# Patient Record
Sex: Male | Born: 1975 | Race: Black or African American | Hispanic: No | Marital: Single | Smoking: Never smoker
Health system: Southern US, Community
[De-identification: ages and names within clinical notes are randomized; demographics above are authoritative.]

## PROBLEM LIST (undated history)

## (undated) DIAGNOSIS — J45909 Unspecified asthma, uncomplicated: Secondary | ICD-10-CM

## (undated) HISTORY — DX: Unspecified asthma, uncomplicated: J45.909

---

## 2005-01-15 ENCOUNTER — Emergency Department (HOSPITAL_COMMUNITY): Admission: EM | Admit: 2005-01-15 | Discharge: 2005-01-15 | Payer: Self-pay | Admitting: Emergency Medicine

## 2005-01-23 ENCOUNTER — Emergency Department (HOSPITAL_COMMUNITY): Admission: EM | Admit: 2005-01-23 | Discharge: 2005-01-23 | Payer: Self-pay | Admitting: Emergency Medicine

## 2005-01-25 ENCOUNTER — Emergency Department (HOSPITAL_COMMUNITY): Admission: EM | Admit: 2005-01-25 | Discharge: 2005-01-25 | Payer: Self-pay | Admitting: Emergency Medicine

## 2012-01-03 ENCOUNTER — Ambulatory Visit (INDEPENDENT_AMBULATORY_CARE_PROVIDER_SITE_OTHER): Payer: BC Managed Care – PPO

## 2012-01-03 DIAGNOSIS — J069 Acute upper respiratory infection, unspecified: Secondary | ICD-10-CM

## 2012-05-01 ENCOUNTER — Ambulatory Visit (INDEPENDENT_AMBULATORY_CARE_PROVIDER_SITE_OTHER): Payer: BC Managed Care – PPO | Admitting: Family Medicine

## 2012-05-01 VITALS — BP 123/73 | HR 61 | Temp 97.9°F | Resp 16 | Ht 68.25 in | Wt 227.4 lb

## 2012-05-01 DIAGNOSIS — M79609 Pain in unspecified limb: Secondary | ICD-10-CM

## 2012-05-01 DIAGNOSIS — S86912A Strain of unspecified muscle(s) and tendon(s) at lower leg level, left leg, initial encounter: Secondary | ICD-10-CM

## 2012-05-01 DIAGNOSIS — S838X9A Sprain of other specified parts of unspecified knee, initial encounter: Secondary | ICD-10-CM

## 2012-05-01 MED ORDER — PREDNISONE 20 MG PO TABS: 40.0000 mg | ORAL_TABLET | Freq: Every day | ORAL | Status: AC

## 2012-05-01 NOTE — Progress Notes (Signed)
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 He notes that he was playing basketball Thursday night but has no recollection of any specific injury. We woke up on Friday, the knee had swollen and was sore. He says the pain is less today although he still limping.  Objective: No acute distress, antalgic gait  Left Knee: Mild diffuse swelling with no localized tenderness. There is mild anterior drawer laxity but no lateral ligamentous instability. He has full range of motion.   assessment: Knee strain, left, mild and improving  Plan: Calling in prednisone 40 mg daily for 5 days. I suggest the patient lay off the basketball during this time. If he feels he cannot work on Monday, we should go ahead and get an MRI of that left knee.

## 2012-05-04 ENCOUNTER — Telehealth: Payer: Self-pay

## 2012-05-04 NOTE — Telephone Encounter (Signed)
Have patient return later this week (Friday or Saturday) and I will recheck the knee

## 2012-05-04 NOTE — Telephone Encounter (Signed)
Pt states that Dr.Lauenstein told him to give him a call regarding his knee. Pt states that his knee is no longer hurting but it is still swollen, also there is tightness behind his knee.

## 2012-05-05 NOTE — Telephone Encounter (Signed)
LMOM for pt to RTC for Dr L to recheck knee if he still has swelling/tightness later in week. Gave pt Dr L's hours on weekend.

## 2013-02-16 ENCOUNTER — Ambulatory Visit (INDEPENDENT_AMBULATORY_CARE_PROVIDER_SITE_OTHER): Payer: BC Managed Care – PPO | Admitting: Family Medicine

## 2013-02-16 VITALS — BP 140/90 | HR 96 | Temp 98.6°F | Resp 16 | Ht 68.0 in | Wt 228.6 lb

## 2013-02-16 DIAGNOSIS — S93409A Sprain of unspecified ligament of unspecified ankle, initial encounter: Secondary | ICD-10-CM

## 2013-02-16 MED ORDER — TRAMADOL HCL 50 MG PO TABS: 50.0000 mg | ORAL_TABLET | Freq: Every evening | ORAL | Status: DC | PRN

## 2013-02-16 MED ORDER — MELOXICAM 15 MG PO TABS: 15.0000 mg | ORAL_TABLET | Freq: Every day | ORAL | Status: DC

## 2013-02-16 MED ORDER — KETOROLAC TROMETHAMINE 60 MG/2ML IM SOLN
60.0000 mg | Freq: Once | INTRAMUSCULAR | Status: AC
  Administered 2013-02-16: 60 mg via INTRAMUSCULAR

## 2013-02-16 NOTE — Patient Instructions (Signed)
Very nice to me. He do have a lateral ankle sprain. Continue to wear that compression sleeve for the next 48 hours. I would ice it 20 minutes 4-5 times daily for the next week. I'm going to keep you out of work until Monday of next week. Then he will start again with no restrictions. I'm giving U. and anti-inflammatory, locking. Taken daily for the next week. Only stopped his medicine if it hurts her stomach. I'm also giving a medicine called tramadol you can take at night if needed. Come back Monday if you are not any better.

## 2013-02-16 NOTE — Progress Notes (Signed)
Chief complaint: Left ankle pain  History of present illness: The patient is having left ankle pain x1 day duration. Patient did roll his ankle in what appears to be a inversion injury. Patient had pain on the anterior lateral aspect of the ankle. Patient state that was fairly difficult to walk but now he is able to ambulate if necessary. Patient denies any numbness or discoloration but states he has had some swelling and put a compression sleeve on his ankle. Patient has not taken any medications for this. Patient has no history of injury to this ankle previously.  Past Medical History  Diagnosis Date  . Asthma    No past surgical history on file.  Family History  Problem Relation Age of Onset  . Heart disease Paternal Grandfather     History  Substance Use Topics  . Smoking status: Never Smoker   . Smokeless tobacco: Not on file  . Alcohol Use: Not on file    Physical exam Blood pressure 140/90, pulse 96, temperature 98.6 F (37 C), temperature source Oral, resp. rate 16, height 5\' 8"  (1.727 m), weight 228 lb 9.6 oz (103.692 kg), SpO2 98.00%. General: No apparent distress alert and oriented x3 mood and affect normal Respiratory: Patient's speak in full sentences and does not appear short of breath Skin: Warm dry intact with no signs of infection or rash Neuro: Cranial nerves II through XII are intact, neurovascularly intact in all extremities with 2+ DTRs and 2+ pulses. Left ankle: Patient does have trace effusion of the left ankle mostly over the ATFL. Patient is tender to palpation over the ATFL but not tender over the medial and lateral malleolus as well as nontender over the fifth metatarsal base. He is neurovascularly intact distally and has good strength except for resisted eversion of the ankle. Patient is able to walk 5 steps.  Assessment: Lateral ankle sprain grade 1  Plan: Continue compression sleeve Icing protocol Patient given anti-inflammatories. Patient given home  exercise program to start in 24 hours Patient will return to work after 48 hours with no restrictions. Patient will return on Monday if he continues to have pain and we'll consider doing x-rays but at this time patient passes [employer].

## 2013-03-10 ENCOUNTER — Telehealth: Payer: Self-pay

## 2013-03-10 NOTE — Telephone Encounter (Addendum)
PT WOULD LIKE TO COME BY AND P/U A COPY OF HIS MEDICAL RECORDS. PLEASE CALL [phone] WAS TOLD IT COULD TAKE UP TO 48 TO 72 HRS.    PT CALLED AGAIN FOR STATUS OF HIS MEDICAL RECORDS. PLEASE CALL [phone]. PT WAS TOLD HE WOULD GET A CALL WHEN RECORDS ARE READY FOR P/U

## 2013-03-18 NOTE — Telephone Encounter (Signed)
Spoke to patient that medical records are ready for pick up at the front office. Please have him sign release that I have filled out for him. Thanks

## 2013-10-03 ENCOUNTER — Ambulatory Visit (INDEPENDENT_AMBULATORY_CARE_PROVIDER_SITE_OTHER): Payer: BC Managed Care – PPO | Admitting: Emergency Medicine

## 2013-10-03 VITALS — BP 118/84 | HR 90 | Temp 101.1°F | Resp 18 | Ht 68.5 in | Wt 230.4 lb

## 2013-10-03 DIAGNOSIS — R509 Fever, unspecified: Secondary | ICD-10-CM

## 2013-10-03 DIAGNOSIS — R51 Headache: Secondary | ICD-10-CM

## 2013-10-03 DIAGNOSIS — R319 Hematuria, unspecified: Secondary | ICD-10-CM

## 2013-10-03 DIAGNOSIS — M549 Dorsalgia, unspecified: Secondary | ICD-10-CM

## 2013-10-03 LAB — COMPREHENSIVE METABOLIC PANEL
ALT: 59 U/L — ABNORMAL HIGH (ref 0–53)
Albumin: 4.7 g/dL (ref 3.5–5.2)
BUN: 12 mg/dL (ref 6–23)
CO2: 30 mEq/L (ref 19–32)
Calcium: 9.7 mg/dL (ref 8.4–10.5)
Chloride: 102 mEq/L (ref 96–112)
Potassium: 4.4 mEq/L (ref 3.5–5.3)
Sodium: 137 mEq/L (ref 135–145)
Total Bilirubin: 1 mg/dL (ref 0.3–1.2)
Total Protein: 7.9 g/dL (ref 6.0–8.3)

## 2013-10-03 LAB — POCT UA - MICROSCOPIC ONLY: Yeast, UA: NEGATIVE

## 2013-10-03 LAB — POCT URINALYSIS DIPSTICK
Ketones, UA: NEGATIVE
Leukocytes, UA: NEGATIVE
Protein, UA: 30
Urobilinogen, UA: 1

## 2013-10-03 LAB — CK: Total CK: 717 U/L — ABNORMAL HIGH (ref 7–232)

## 2013-10-03 LAB — POCT CBC
HCT, POC: 43.5 % (ref 43.5–53.7)
Hemoglobin: 13.7 g/dL — AB (ref 14.1–18.1)
Lymph, poc: 0.9 (ref 0.6–3.4)
MCHC: 31.5 g/dL — AB (ref 31.8–35.4)
MCV: 88 fL (ref 80–97)
POC Granulocyte: 3.9 (ref 2–6.9)

## 2013-10-03 LAB — POCT INFLUENZA A/B: Influenza A, POC: NEGATIVE

## 2013-10-03 LAB — POCT RAPID STREP A (OFFICE): Rapid Strep A Screen: NEGATIVE

## 2013-10-03 NOTE — Progress Notes (Addendum)
Subjective:    Patient ID: Jonathan Middleton, male    DOB: June 30, 1976, 37 y.o.   MRN: [id number]  HPI This chart was scribed for Brett Canales Duel Conrad-MD, by Ladona Ridgel Day, Scribe. This patient was seen in room 13 and the patient's care was started at 11:07 AM.  HPI Comments: Jonathan Middleton is a 37 y.o. male who presents to the [facility] complaining of a 6/10 in pain headache that began yesterday morning and later in the evening began with back/leg pain, and states it feels like flu symptoms. He reports associated chills and nasal congestion. He states HA feels worse with bumps on road while driving. He denies any associated fever, eye irritation, ear ache, cough, nausea, emesis, diarrhea, recent tick bites, recent travels and states no sick contacts. He reports tried Nyquil last PM for this problem which helped him sleep but did not improve his symptoms.  He works for [employer] and [employer].   Past Medical History  Diagnosis Date  . Asthma     History reviewed. No pertinent past surgical history.  Family History  Problem Relation Age of Onset  . Heart disease Paternal Grandfather     History   Social History  . Marital Status: Single    Spouse Name: N/A    Number of Children: N/A  . Years of Education: N/A   Occupational History  . Not on file.   Social History Main Topics  . Smoking status: Never Smoker   . Smokeless tobacco: Not on file  . Alcohol Use: No  . Drug Use: No  . Sexual Activity: Not on file   Other Topics Concern  . Not on file   Social History Narrative  . No narrative on file    No Known Allergies  There are no active problems to display for this patient.   No results found for this or any previous visit.  No diagnosis found.  No orders of the defined types were placed in this encounter.     Review of Systems  Constitutional: Positive for chills. Negative for fever.  HENT: Negative for sore throat.   Gastrointestinal: Negative for nausea,  vomiting and diarrhea.  Musculoskeletal: Positive for back pain.  Neurological: Positive for headaches.       Objective:   Physical Exam patient appears that he does not feel well but does not look toxic. His pupils are equal and reactive. His TMs are normal nose is congested the throat is not red there is no exudate. There is no cervical adenopathy noted. His chest was clear heart regular rate no murmurs abdomen was soft liver spleen not enlarged. There is no definite nuchal rigidity. With forward flexion of the neck to the chest he does state that he has neck stiffness and tightness. I was able to get the patient in a fetal type position without worsening of his neck or head discomfort.    His genital exam reveals a normal penis. Testicles are descended and nontender. There is no urethral discharge  Results for orders placed in visit on 10/03/13  POCT CBC      Result Value Range   WBC 5.4  4.6 - 10.2 K/uL   Lymph, poc 0.9  0.6 - 3.4   POC LYMPH PERCENT 17.3  10 - 50 %L   MID (cbc) 0.6  0 - 0.9   POC MID % 10.6  0 - 12 %M   POC Granulocyte 3.9  2 - 6.9   Granulocyte percent  72.1  37 - 80 %G   RBC 4.94  4.69 - 6.13 M/uL   Hemoglobin 13.7 (*) 14.1 - 18.1 g/dL   HCT, POC 16.1  09.6 - 53.7 %   MCV 88.0  80 - 97 fL   MCH, POC 27.7  27 - 31.2 pg   MCHC 31.5 (*) 31.8 - 35.4 g/dL   RDW, POC 04.5     Platelet Count, POC 242  142 - 424 K/uL   MPV 8.5  0 - 99.8 fL  POCT RAPID STREP A (OFFICE)      Result Value Range   Rapid Strep A Screen Negative  Negative  POCT INFLUENZA A/B      Result Value Range   Influenza A, POC Negative     Influenza B, POC Negative    POCT URINALYSIS DIPSTICK      Result Value Range   Color, UA amber     Clarity, UA clear     Glucose, UA neg     Bilirubin, UA neg     Ketones, UA neg     Spec Grav, UA 1.020     Blood, UA small     pH, UA 6.0     Protein, UA 30 mg/dL     Urobilinogen, UA 1.0     Nitrite, UA neg     Leukocytes, UA Negative    POCT UA -  MICROSCOPIC ONLY      Result Value Range   WBC, Ur, HPF, POC 4-6     RBC, urine, microscopic 20-28 with clumps     Bacteria, U Microscopic 1+     Mucus, UA Positive     Epithelial cells, urine per micros 4-8     Crystals, Ur, HPF, POC Neg     Casts, Ur, LPF, POC Neg     Yeast, UA Neg          Assessment & Plan:   patient presents with what sounds like a viral type illness with headache back pain myalgias and fever. He has not had any recent travel except to visit family in [place]. He has a history of having blood in nature and during a checkup recently. It is unclear whether this is related to his present illness. A stat CK was done. He was instructed to present to the emergency room if he were to develop neck stiffness or worsening of his headache. He will take Tylenol for his symptoms. We will schedule a CT noncontrast of the kidneys and bladder in the near future . His temperature came down  to 100.6 after Tylenol.

## 2013-10-03 NOTE — Patient Instructions (Signed)
If you have worsening of your headache or develop worsening stiffness in your neck please present to the emergency room for further evaluation. Please take 2 extra strength Tylenol every 6 hours make sure you drink large quantities of fluids. Return to clinic in the morning to see Eula Listen

## 2013-10-04 ENCOUNTER — Ambulatory Visit: Payer: BC Managed Care – PPO

## 2013-10-04 ENCOUNTER — Ambulatory Visit (INDEPENDENT_AMBULATORY_CARE_PROVIDER_SITE_OTHER): Payer: BC Managed Care – PPO | Admitting: Physician Assistant

## 2013-10-04 VITALS — BP 119/73 | HR 79 | Temp 98.7°F | Resp 18 | Wt 230.0 lb

## 2013-10-04 DIAGNOSIS — R5381 Other malaise: Secondary | ICD-10-CM

## 2013-10-04 DIAGNOSIS — R319 Hematuria, unspecified: Secondary | ICD-10-CM

## 2013-10-04 DIAGNOSIS — R509 Fever, unspecified: Secondary | ICD-10-CM

## 2013-10-04 LAB — POCT UA - MICROSCOPIC ONLY
Bacteria, U Microscopic: NEGATIVE
Casts, Ur, LPF, POC: NEGATIVE
Crystals, Ur, HPF, POC: NEGATIVE
Epithelial cells, urine per micros: NEGATIVE

## 2013-10-04 LAB — COMPREHENSIVE METABOLIC PANEL
ALT: 39 U/L (ref 0–53)
AST: 31 U/L (ref 0–37)
CO2: 25 mEq/L (ref 19–32)
Calcium: 9.4 mg/dL (ref 8.4–10.5)
Chloride: 102 mEq/L (ref 96–112)
Glucose, Bld: 101 mg/dL — ABNORMAL HIGH (ref 70–99)
Potassium: 4.3 mEq/L (ref 3.5–5.3)
Sodium: 137 mEq/L (ref 135–145)
Total Bilirubin: 0.5 mg/dL (ref 0.3–1.2)
Total Protein: 7.1 g/dL (ref 6.0–8.3)

## 2013-10-04 LAB — POCT CBC
Granulocyte percent: 43.3 %G (ref 37–80)
HCT, POC: 43.4 % — AB (ref 43.5–53.7)
Hemoglobin: 13.5 g/dL — AB (ref 14.1–18.1)
Lymph, poc: 1.5 (ref 0.6–3.4)
MCHC: 31.1 g/dL — AB (ref 31.8–35.4)
MPV: 8.5 fL (ref 0–99.8)
POC Granulocyte: 1.5 — AB (ref 2–6.9)
POC MID %: 11.9 %M (ref 0–12)
Platelet Count, POC: 233 10*3/uL (ref 142–424)

## 2013-10-04 LAB — POCT URINALYSIS DIPSTICK
Bilirubin, UA: NEGATIVE
Glucose, UA: NEGATIVE
Nitrite, UA: NEGATIVE
Protein, UA: NEGATIVE
pH, UA: 6.5

## 2013-10-04 LAB — URINE CULTURE: Organism ID, Bacteria: NO GROWTH

## 2013-10-04 LAB — CK: Total CK: 426 U/L — ABNORMAL HIGH (ref 7–232)

## 2013-10-04 NOTE — Progress Notes (Signed)
Patient ID: Jonathan Middleton MRN: [id number], DOB: 06/15/1976, 37 y.o. Date of Encounter: 10/04/2013, 3:13 PM  Primary Physician: No primary provider on file.  Chief Complaint: Follow up  HPI: 37 y.o. male with history below presents for follow up of possible viral illness. Patient was seen at clinic on 10/03/13 with a 1 day history of 6/10 headache. He then developed some back and leg pain and stated that he felt like he was developing the flu. He reported associated fevers, chills, myalgias, and nasal congestion. He denied any otalgia, cough, nausea, emesis, diarrhea, recent tick bites, recent travels, or known sick contacts.   At his visit on 10/03/13 he was found to have a WBC count of 5.4 with 72.1% segs, negative RST, negative influenza A and B, UA with small blood, WBC 4-6, RBC 20-28 with clumps, and CK of 717. He was advised to take Tylenol for his symptoms and follow up today. He states that he does have a history of hematuria, and it is unclear if this is part of his illness or from prior. A CT of his kidneys and bladder is being planned in the near future.   Today he comes in feeling much better. No further fevers, chills, headaches, backaches, or myalgias. Still with some nasal congestion and fatigue though. Has remained afebrile since the previous evening without the aide of antipyretics. He thinks that his sore neck was from sleeping on the floor the evening before presenting to care on 10/03/13. No further sore or stiff neck. No rash. No abdominal pain, nausea, vomiting, or diarrhea. Has been pushing fluids. He did have to force himself to eat the previous evening, but his appetite returned this morning.   He denies any muscle cramps or pain prior to two days ago. He is not on any long term medications. 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 He has been doing this for 8 years. He denies any urinary symptoms. No dysuria, urinary frequency,  urgency, or discharge. No history of kidney stones.     Past Medical History  Diagnosis Date  . Asthma      Home Meds: Prior to Admission medications   Medication Sig Start Date End Date Taking? Authorizing Provider                  Allergies: No Known Allergies  History   Social History  . Marital Status: Single    Spouse Name: N/A    Number of Children: N/A  . Years of Education: N/A   Occupational History  . Not on file.   Social History Main Topics  . Smoking status: Never Smoker   . Smokeless tobacco: Not on file  . Alcohol Use: No  . Drug Use: No  . Sexual Activity: Not on file   Other Topics Concern  . Not on file   Social History Narrative  . No narrative on file     Review of Systems: Constitutional: positive for fatigue. negative for chills or fever  HEENT: positive for nasal congestion. negative for vision changes, hearing loss, rhinorrhea, ST, or sinus pressure Cardiovascular: negative for chest pain or palpitations Respiratory: negative for wheezing, shortness of breath, or cough Abdominal: negative for abdominal pain, nausea, vomiting, or diarrhea Dermatological: negative for rash Neurologic: negative for headache   Physical Exam: Blood pressure 119/73, pulse 79, temperature 98.7 F (37.1 C), temperature source Oral, resp. rate 18, weight 230 lb (104.327  kg)., Body mass index is 34.46 kg/(m^2). General: Well developed, well nourished, in no acute distress. Head: Normocephalic, atraumatic, eyes without discharge, sclera non-icteric, nares are congested. Bilateral auditory canals clear, TM's are without perforation, pearly grey and translucent with reflective cone of light bilaterally. Oral cavity moist, posterior pharynx without exudate, erythema, peritonsillar abscess, or post nasal drip. Uvula midline.  Neck: Supple. No thyromegaly. Full ROM. No lymphadenopathy. No nuchal rigidity.  Lungs: Clear bilaterally to auscultation without wheezes,  rales, or rhonchi. Breathing is unlabored. Heart: RRR with S1 S2. No murmurs, rubs, or gallops appreciated. Abdomen: Soft, non-tender, non-distended with normoactive bowel sounds. No hepatosplenomegaly. No rebound/guarding. No obvious abdominal masses. Msk:  Strength and tone normal for age. Extremities/Skin: Warm and dry. No clubbing or cyanosis. No edema. No rashes or suspicious lesions. Neuro: Alert and oriented X 3. Moves all extremities spontaneously. Gait is normal. CNII-XII grossly in tact. Psych:  Responds to questions appropriately with a normal affect.   Labs: Reviewed prior labs. Results for orders placed in visit on 10/04/13  POCT URINALYSIS DIPSTICK      Result Value Range   Color, UA yellow     Clarity, UA cloudy     Glucose, UA neg     Bilirubin, UA neg     Ketones, UA neg     Spec Grav, UA 1.020     Blood, UA trace     pH, UA 6.5     Protein, UA neg     Urobilinogen, UA 0.2     Nitrite, UA neg     Leukocytes, UA Negative    POCT UA - MICROSCOPIC ONLY      Result Value Range   WBC, Ur, HPF, POC neg     RBC, urine, microscopic 0-1     Bacteria, U Microscopic neg     Mucus, UA neg     Epithelial cells, urine per micros neg     Crystals, Ur, HPF, POC neg     Casts, Ur, LPF, POC neg     Yeast, UA neg    POCT CBC      Result Value Range   WBC 3.4 (*) 4.6 - 10.2 K/uL   Lymph, poc 1.5  0.6 - 3.4   POC LYMPH PERCENT 44.8  10 - 50 %L   MID (cbc) 0.4  0 - 0.9   POC MID % 11.9  0 - 12 %M   POC Granulocyte 1.5 (*) 2 - 6.9   Granulocyte percent 43.3  37 - 80 %G   RBC 4.92  4.69 - 6.13 M/uL   Hemoglobin 13.5 (*) 14.1 - 18.1 g/dL   HCT, POC 40.9 (*) 81.1 - 53.7 %   MCV 88.2  80 - 97 fL   MCH, POC 27.4  27 - 31.2 pg   MCHC 31.1 (*) 31.8 - 35.4 g/dL   RDW, POC 91.4     Platelet Count, POC 233  142 - 424 K/uL   MPV 8.5  0 - 99.8 fL    CK stat  [facility] reading (PRIMARY) by  Dr. Merla Riches.  AAS: Negative  ASSESSMENT AND PLAN:  37 y.o. male with resolving likely  viral URI, elevated CK, and hematuria.  1) Likely viral URI -Resolving -Continue supportive care -Will remain OOW through 10/05/13 -Await labs  2) Elevated CK -Trend level  3) Hematuria -Unclear if this is an issue that was present prior to his acute illness or not -Will work up with noncontrast CT  of kidneys and bladder, this has already been ordered by Dr. Cleta Alberts on 10/03/13 -Push water    Signed, Eula Listen, PA-C [facility] [place], [place] [address] [phone] 10/04/2013 3:13 PM

## 2013-10-05 LAB — EPSTEIN-BARR VIRUS VCA ANTIBODY PANEL
EBV VCA IgG: <10 U/mL (ref <18.0)
EBV VCA IgM: <10 U/mL (ref <36.0)

## 2013-10-05 LAB — CULTURE, GROUP A STREP: Organism ID, Bacteria: NORMAL

## 2013-10-06 ENCOUNTER — Telehealth: Payer: Self-pay

## 2013-10-06 LAB — CMV IGM: CMV IgM: <8 AU/mL (ref <30.00)

## 2013-10-06 NOTE — Telephone Encounter (Signed)
PT states he has already spoke to someone about his labs twice and someone has called him back again about his labs and he is wondering if he needs to speak to someone again or not Call back number is [phone]

## 2013-10-06 NOTE — Telephone Encounter (Signed)
Yes, Dr. Cleta Alberts drew labs on 10/20 and Alycia Rossetti drew labs on 10/21 and we were calling about his latest labs-see lab message

## 2013-11-06 ENCOUNTER — Ambulatory Visit (INDEPENDENT_AMBULATORY_CARE_PROVIDER_SITE_OTHER): Payer: BC Managed Care – PPO | Admitting: Family Medicine

## 2013-11-06 VITALS — BP 122/76 | HR 87 | Temp 98.4°F | Resp 17 | Ht 68.5 in | Wt 230.0 lb

## 2013-11-06 DIAGNOSIS — R300 Dysuria: Secondary | ICD-10-CM

## 2013-11-06 LAB — POCT URINALYSIS DIPSTICK
Glucose, UA: NEGATIVE
Ketones, UA: NEGATIVE
Leukocytes, UA: NEGATIVE
Nitrite, UA: NEGATIVE
Protein, UA: NEGATIVE
Spec Grav, UA: 1.02
Urobilinogen, UA: 0.2
pH, UA: 6

## 2013-11-06 LAB — POCT UA - MICROSCOPIC ONLY
Crystals, Ur, HPF, POC: NEGATIVE
WBC, Ur, HPF, POC: NEGATIVE
Yeast, UA: NEGATIVE

## 2013-11-06 MED ORDER — AZITHROMYCIN 250 MG PO TABS: ORAL_TABLET | ORAL | Status: DC

## 2013-11-06 MED ORDER — CEFTRIAXONE SODIUM 1 G IJ SOLR
1.0000 g | Freq: Once | INTRAMUSCULAR | Status: AC
  Administered 2013-11-06: 1 g via INTRAMUSCULAR

## 2013-11-06 NOTE — Progress Notes (Signed)
Subjective:  This chart was scribed for Jonathan Simmer, MD by Carl Best, Medical Scribe. This patient was seen in Room 1 and the patient's care was started at 11:53 AM.   Patient ID: Jonathan Middleton, male    DOB: [birth year], 37 y.o.   MRN: [id number]  HPI HPI Comments: Jonathan Middleton is a 37 y.o. male who presents to the [facility] complaining of a urgency, lower back pain, and dysuria that started five days ago.  The patient states that he experienced these symptoms when he was 37 years old and was diagnosed with a UTI.  He denies fever, chills, nausea, emesis, hematuria, swelling or pain in testicles as associated symptoms.  He lists diaphoresis, nocturia, frequency, and a slower urine stream, and abdominal pain as associated symptoms.  He states that he cannot remember how many times he used the bathroom last night.  The patient states that his last episode of urination was this morning.    He states that he is not married and is currently not dating.  He states that he had sex 2-3 weeks ago with an ex-girlfriend.  He states that he was diagnosed with chlamydia 10 years ago.  He states that he has had sex with about 10 partners in his lifetime.  He states that he only dates females.    He denies experiencing any other health problems.  He denies having a history of kidney stones.    The patient denies a family history of prostate cancer.    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  He states that he likes his job.  He states that he has been drinking a lot of soda recently.    The patient was last seen at [facility] a month ago and his UA showed hematuria.  He was recommended to get a CT scan but states that he was unable to get one due to his work schedule.     Review of Systems  Constitutional: Positive for diaphoresis. Negative for fever and chills.  Gastrointestinal: Positive for abdominal pain. Negative for nausea and vomiting.  Endocrine:  Positive for polyuria.  Genitourinary: Positive for dysuria, urgency, frequency and decreased urine volume. Negative for hematuria, scrotal swelling, penile pain and testicular pain.  Musculoskeletal: Positive for back pain (lower).  All other systems reviewed and are negative.      Objective:  Physical Exam  Nursing note and vitals reviewed. Constitutional: He is oriented to person, place, and time. He appears well-developed and well-nourished. No distress.  HENT:  Head: Normocephalic and atraumatic.  Eyes: Conjunctivae and EOM are normal. Pupils are equal, round, and reactive to light.  Neck: Normal range of motion. Neck supple.  Cardiovascular: Normal rate, regular rhythm and normal heart sounds.  Exam reveals no gallop and no friction rub.   No murmur heard. Pulmonary/Chest: Effort normal and breath sounds normal. No respiratory distress. He has no wheezes. He has no rales.  Abdominal: Soft. Normal appearance and bowel sounds are normal. There is tenderness in the right lower quadrant. There is no rebound and no guarding.  Mild tenderness to palpation over RLQ.   Genitourinary: Testes normal and penis normal. Prostate is tender (mildly tender to palpation). Prostate is not enlarged. No penile tenderness.  Musculoskeletal: Normal range of motion. He exhibits no edema.  Neurological: He is alert and oriented to person, place, and time.  Skin: Skin is warm and dry.  Psychiatric:  He has a normal mood and affect. His behavior is normal.   Results for orders placed in visit on 11/06/13  URINE CULTURE      Result Value Ref Range   Colony Count NO GROWTH     Organism ID, Bacteria NO GROWTH    GC/CHLAMYDIA PROBE AMP      Result Value Ref Range   CT Probe RNA NEGATIVE     GC Probe RNA NEGATIVE    POCT URINALYSIS DIPSTICK      Result Value Ref Range   Color, UA yellow     Clarity, UA clear     Glucose, UA neg     Bilirubin, UA neg     Ketones, UA neg     Spec Grav, UA 1.020      Blood, UA trace-lysed     pH, UA 6.0     Protein, UA neg     Urobilinogen, UA 0.2     Nitrite, UA neg     Leukocytes, UA Negative    POCT UA - MICROSCOPIC ONLY      Result Value Ref Range   WBC, Ur, HPF, POC neg     RBC, urine, microscopic 0-1     Bacteria, U Microscopic trace     Mucus, UA trace     Epithelial cells, urine per micros neg     Crystals, Ur, HPF, POC neg     Casts, Ur, LPF, POC neg     Yeast, UA neg        Assessment & Plan:   1. Burning with urination    Meds ordered this encounter  Medications  . cefTRIAXone (ROCEPHIN) injection 1 g    Sig:   . azithromycin (ZITHROMAX) 250 MG tablet    Sig: 4 tablets po x 1    Dispense:  4 tablet    Refill:  0   I personally performed the services described in this documentation, which was scribed in my presence.  The recorded information has been reviewed and is accurate.  Jonathan Middleton, M.D.  [facility] [address] [place], [place]  [address] [phone] phone [phone] fax

## 2013-11-07 LAB — URINE CULTURE
Colony Count: NO GROWTH
Organism ID, Bacteria: NO GROWTH

## 2013-11-08 LAB — GC/CHLAMYDIA PROBE AMP
CT Probe RNA: NEGATIVE
GC Probe RNA: NEGATIVE

## 2013-11-13 ENCOUNTER — Telehealth: Payer: Self-pay

## 2013-11-13 NOTE — Telephone Encounter (Signed)
Patient is returning call about lab results.  [phone]

## 2013-11-13 NOTE — Telephone Encounter (Signed)
See labs 

## 2013-11-22 ENCOUNTER — Ambulatory Visit
Admission: RE | Admit: 2013-11-22 | Discharge: 2013-11-22 | Disposition: A | Discharge status: Home / Self Care | Payer: BC Managed Care – PPO | Source: Ambulatory Visit | Attending: Emergency Medicine | Admitting: Emergency Medicine

## 2013-11-22 DIAGNOSIS — R319 Hematuria, unspecified: Secondary | ICD-10-CM

## 2013-11-23 ENCOUNTER — Telehealth: Payer: Self-pay

## 2013-11-23 NOTE — Telephone Encounter (Signed)
Pt had labs done 11/23 and all of his labs were normal at that time and pt was notified. LMOM for him to CB to see if he had any further questions.

## 2013-11-23 NOTE — Telephone Encounter (Signed)
Patient returned call about lab results  [phone]

## 2013-11-23 NOTE — Telephone Encounter (Signed)
Pt was calling about CT--notified of labs

## 2015-03-12 ENCOUNTER — Ambulatory Visit (INDEPENDENT_AMBULATORY_CARE_PROVIDER_SITE_OTHER): Payer: BLUE CROSS/BLUE SHIELD | Admitting: Family Medicine

## 2015-03-12 ENCOUNTER — Ambulatory Visit (INDEPENDENT_AMBULATORY_CARE_PROVIDER_SITE_OTHER): Payer: BLUE CROSS/BLUE SHIELD

## 2015-03-12 VITALS — BP 120/82 | HR 86 | Temp 97.9°F | Resp 16 | Ht 68.5 in | Wt 228.5 lb

## 2015-03-12 DIAGNOSIS — M5410 Radiculopathy, site unspecified: Secondary | ICD-10-CM | POA: Diagnosis not present

## 2015-03-12 DIAGNOSIS — R202 Paresthesia of skin: Secondary | ICD-10-CM

## 2015-03-12 DIAGNOSIS — M7542 Impingement syndrome of left shoulder: Secondary | ICD-10-CM

## 2015-03-12 DIAGNOSIS — M75102 Unspecified rotator cuff tear or rupture of left shoulder, not specified as traumatic: Secondary | ICD-10-CM

## 2015-03-12 DIAGNOSIS — Z131 Encounter for screening for diabetes mellitus: Secondary | ICD-10-CM

## 2015-03-12 LAB — GLUCOSE, POCT (MANUAL RESULT ENTRY): POC Glucose: 96 mg/dL (ref 70–99)

## 2015-03-12 LAB — POCT GLYCOSYLATED HEMOGLOBIN (HGB A1C): Hemoglobin A1C: 5.6

## 2015-03-12 MED ORDER — MELOXICAM 7.5 MG PO TABS: 7.5000 mg | ORAL_TABLET | Freq: Two times a day (BID) | ORAL | Status: DC | PRN

## 2015-03-12 MED ORDER — CYCLOBENZAPRINE HCL 5 MG PO TABS: 5.0000 mg | ORAL_TABLET | Freq: Every evening | ORAL | Status: DC | PRN

## 2015-03-12 MED ORDER — METHYLPREDNISOLONE (PAK) 4 MG PO TABS: ORAL_TABLET | ORAL | Status: DC

## 2015-03-12 NOTE — Patient Instructions (Signed)

## 2015-03-12 NOTE — Progress Notes (Signed)
Chief Complaint:  Chief Complaint  Patient presents with  . Numbness    left arm, x a few days    HPI: Jonathan Middleton is a 39 y.o. male who is here for  left arm numbness and tingling for the last 3-4 days. He denies "pain". He has numbness and tingling that starts at his shoulder and radiates down on the inside of his upper arm past his elbow and down his hands. His second third and fourth fingers are numb and tingly. The radicular numbness and tingling travels for the most part medially. He denies any known shoulder trauma. However he has had rotator cuff issues in the past but does not remember if it was the right shoulder or the left shoulder. He does not have any neck pain or prior history of neck injuries. He played a lot of sports in high school including basketball, track, football. He is right-hand dominant. He works at [employer] during the week lifting boxes, during the weekends he drives. It bothers him when he lifts his arm up at to shoulder height or is using his hand to grip the steering wheel he has numbness and tingling going down his shoulder to his hands. He also works part-time as a Production designer, theatre/television/filmmanager at [employer]. He does not do a lot of repetitive motion there. He does not sleep on his left shoulder because it is uncomfortable. He has not tried much for this. He denies any diabetes personally. He denies pain or weakness.  Past Medical History  Diagnosis Date  . Asthma    History reviewed. No pertinent past surgical history. History   Social History  . Marital Status: Single    Spouse Name: N/A  . Number of Children: N/A  . Years of Education: N/A   Social History Main Topics  . Smoking status: Never Smoker   . Smokeless tobacco: Not on file  . Alcohol Use: No  . Drug Use: No  . Sexual Activity: Not on file   Other Topics Concern  . None   Social History Narrative   Family History  Problem Relation Age of Onset  . Heart disease Paternal Grandfather    No Known  Allergies Prior to Admission medications   Medication Sig Start Date End Date Taking? Authorizing Provider  azithromycin (ZITHROMAX) 250 MG tablet 4 tablets po x 1 Patient not taking: Reported on 03/12/2015 11/06/13   Ethelda ChickKristi M Smith, MD     ROS: The patient denies fevers, chills, night sweats, unintentional weight loss, chest pain, palpitations, wheezing, dyspnea on exertion, nausea, vomiting, abdominal pain, dysuria, hematuria, melena,  weakness  All other systems have been reviewed and were otherwise negative with the exception of those mentioned in the HPI and as above.    PHYSICAL EXAM: Filed Vitals:   03/12/15 1220  BP: 120/82  Pulse: 86  Temp: 97.9 F (36.6 C)  Resp: 16   Filed Vitals:   03/12/15 1220  Height: 5' 8.5" (1.74 m)  Weight: 228 lb 8 oz (103.647 kg)   Body mass index is 34.23 kg/(m^2).  General: Alert, no acute distress HEENT:  Normocephalic, atraumatic, oropharynx patent. EOMI, PERRLA Cardiovascular:  Regular rate and rhythm, no rubs murmurs or gallops.  No Carotid bruits, radial pulse intact. No pedal edema.  Respiratory: Clear to auscultation bilaterally.  No wheezes, rales, or rhonchi.  No cyanosis, no use of accessory musculature GI: No organomegaly, abdomen is soft and non-tender, positive bowel sounds.  No masses. Skin:  No rashes. Neurologic: Facial musculature symmetric. Psychiatric: Patient is appropriate throughout our interaction. Lymphatic: No cervical lymphadenopathy Musculoskeletal: Gait intact. Neck exam normal-neg spurling Left Shoulder No deformity, no hypertrophy/atrophy, no erythema, no fluid, no wounds, no crepitus Full ROM Nontender at [facility] jt Neg Empty Can test, neg Lift off test, neg Speeds, +Hawkins/Neers 5/5 strength, 2/2 triceps and biceps DTRs Positive Phalen's, positive Tinel's of the left wrist Negative Lourena Simmonds    LABS: Results for orders placed or performed in visit on 03/12/15  POCT glycosylated hemoglobin (Hb A1C)    Result Value Ref Range   Hemoglobin A1C 5.6   POCT glucose (manual entry)  Result Value Ref Range   POC Glucose 96 70 - 99 mg/dl     EKG/XRAY:   Primary read interpreted by Dr. Conley Rolls at [facility]. 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  . Paresthesia of left arm Yes  . Rotator cuff impingement syndrome of left shoulder   . Screening for diabetes mellitus (DM)   . Radicular neuropathy    Pleasant 39 year old African-American male who works for [employer] and does a lot of repetitive motion presents with possible foraminal stenoses on cervical spine versus his rotator cuff impingement syndrome causing his radicular neuropathy vs less likely carpal tunnel syndrome I was not able to elicit any replication of radicular pain with axial loading on exam of his cervical spine. However there is significant DJD with possible strep foraminal stenosis on x-ray of C-spine which could be contributing to this He does have some impingement signs of the rotator cuff We will try a Medrol dose pack, Flexeril. If he continues to have neuropathy then we can try Mobic prn. He was advised not to take the Mobic and the Medrol Dosepak together. If this worsens or continues or he has weakness and we will do a nerve conduction study Follow-up when necessary or in one month  Gross sideeffects, risk and benefits, and alternatives of medications d/w patient. Patient is aware that all medications have potential sideeffects and we are unable to predict every sideeffect or drug-drug interaction that may occur.  LE, THAO PHUONG, DO 03/12/2015 1:47 PM

## 2015-11-06 ENCOUNTER — Ambulatory Visit (INDEPENDENT_AMBULATORY_CARE_PROVIDER_SITE_OTHER): Payer: BLUE CROSS/BLUE SHIELD | Admitting: Family Medicine

## 2015-11-06 VITALS — BP 128/78 | HR 72 | Temp 98.6°F | Resp 18 | Ht 68.5 in | Wt 217.8 lb

## 2015-11-06 DIAGNOSIS — J0100 Acute maxillary sinusitis, unspecified: Secondary | ICD-10-CM

## 2015-11-06 MED ORDER — AMOXICILLIN-POT CLAVULANATE 875-125 MG PO TABS: 1.0000 | ORAL_TABLET | Freq: Two times a day (BID) | ORAL | Status: DC

## 2015-11-06 NOTE — Progress Notes (Signed)
This chart was scribed for Elvina Sidle, MD by Watt Climes Rifaie medical scribe at [facility].The patient was seen in exam room 14  and the patient's care was started at 9:40 AM.  Patient ID: Jonathan Middleton MRN: [id number], DOB: 10-07-1976, 39 y.o. Date of Encounter: 11/06/2015  Primary Physician: No primary care provider on file.  Chief Complaint:  Chief Complaint  Patient presents with  . Nasal Congestion    x sunday  . Sore Throat    x sunday    HPI:  Jonathan Middleton is a 39 y.o. male who presents to [facility] complaining of congestion, first onset 2 days ago.  Pt reports associated symptoms of sore throat. Pt denies cough, or any allergies.   Pt is a driver for [employer].    Past Medical History  Diagnosis Date  . Asthma      Home Meds: Prior to Admission medications   Medication Sig Start Date End Date Taking? Authorizing Provider  cyclobenzaprine (FLEXERIL) 5 MG tablet Take 1 tablet (5 mg total) by mouth at bedtime as needed for muscle spasms. Patient not taking: Reported on 11/06/2015 03/12/15   Thao P Le, DO  meloxicam (MOBIC) 7.5 MG tablet Take 1 tablet (7.5 mg total) by mouth 2 (two) times daily as needed for pain. Take with food, start after the steroids, no other NSAIDs Patient not taking: Reported on 11/06/2015 03/12/15   Thao P Le, DO  methylPREDNIsolone (MEDROL DOSPACK) 4 MG tablet follow package directions Patient not taking: Reported on 11/06/2015 03/12/15   Thao P Le, DO    Allergies: No Known Allergies  Social History   Social History  . Marital Status: Single    Spouse Name: N/A  . Number of Children: N/A  . Years of Education: N/A   Occupational History  . Not on file.   Social History Main Topics  . Smoking status: Never Smoker   . Smokeless tobacco: Not on file  . Alcohol Use: No  . Drug Use: No  . Sexual Activity: Not on file   Other Topics Concern  . Not on file   Social History Narrative      Review of Systems: Constitutional: negative for chills, fever, night sweats, weight changes, or fatigue  HEENT: negative for vision changes, hearing loss, rhinorrhea, ST, epistaxis, or sinus pressure. Positive for congestion, sore throat.  Cardiovascular: negative for chest pain or palpitations Respiratory: negative for hemoptysis, wheezing, shortness of breath, or cough Abdominal: negative for abdominal pain, nausea, vomiting, diarrhea, or constipation Dermatological: negative for rash Neurologic: negative for headache, dizziness, or syncope All other systems reviewed and are otherwise negative with the exception to those above and in the HPI.  Physical Exam: Blood pressure 128/78, pulse 72, temperature 98.6 F (37 C), temperature source Oral, resp. rate 18, height 5' 8.5" (1.74 m), weight 217 lb 12.8 oz (98.793 kg), SpO2 98 %., Body mass index is 32.63 kg/(m^2). General: Well developed, well nourished, in no acute distress. Head: Normocephalic, atraumatic, eyes without discharge, sclera non-icteric, nares are without discharge. Bilateral auditory canals clear, TM's are without perforation, pearly grey and translucent with reflective cone of light bilaterally. Oral cavity moist, posterior pharynx without exudate, peritonsillar abscess, or post nasal drip. Throat is red. Sinus passages are swollen.   Neck: Supple. No thyromegaly. Full ROM. No lymphadenopathy. Lungs: Clear bilaterally to auscultation without wheezes, rales, or rhonchi. Breathing is unlabored. Heart: RRR with S1 S2. No murmurs, rubs, or  gallops appreciated. Msk:  Strength and tone normal for age. Extremities/Skin: Warm and dry. No clubbing or cyanosis. No edema. No rashes or suspicious lesions. Neuro: Alert and oriented X 3. Moves all extremities spontaneously. Gait is normal. CNII-XII grossly in tact. Psych:  Responds to questions appropriately with a normal affect.    ASSESSMENT AND PLAN:  39 y.o. year old male with  acute sinusitis   By signing my name below, I, Rawaa Al Rifaie, attest that this documentation has been prepared under the direction and in the presence of Elvina SidleKurt Rawley Harju, MD.  Broadus Johnawaa Al Rifaie, Medical Scribe. 11/06/2015.  9:42 AM.  Acute maxillary sinusitis, recurrence not specified - Plan: amoxicillin-clavulanate (AUGMENTIN) 875-125 MG tablet   Signed, Elvina SidleKurt Ashaunte Standley, MD 11/06/2015 9:40 AM

## 2016-08-15 ENCOUNTER — Ambulatory Visit (INDEPENDENT_AMBULATORY_CARE_PROVIDER_SITE_OTHER): Payer: BLUE CROSS/BLUE SHIELD | Admitting: Physician Assistant

## 2016-08-15 VITALS — BP 122/82 | HR 64 | Temp 98.7°F | Resp 18 | Ht 69.0 in | Wt 223.4 lb

## 2016-08-15 DIAGNOSIS — M25461 Effusion, right knee: Secondary | ICD-10-CM | POA: Diagnosis not present

## 2016-08-15 DIAGNOSIS — M25561 Pain in right knee: Secondary | ICD-10-CM | POA: Diagnosis not present

## 2016-08-15 MED ORDER — MELOXICAM 15 MG PO TABS: 15.0000 mg | ORAL_TABLET | Freq: Every day | ORAL | 1 refills | Status: DC

## 2016-08-15 NOTE — Patient Instructions (Addendum)
Ice a few times a day.  Rx Mobic  RTC if you are still experiencing knee pain in one month. We will get images at that time.     IF you received an x-ray today, you will receive an invoice from [facility]. Please contact [facility] at [phone] with questions or concerns regarding your invoice.   IF you received labwork today, you will receive an invoice from [facility]. Please contact [facility] at [phone] with questions or concerns regarding your invoice.   Our billing staff will not be able to assist you with questions regarding bills from these companies.  You will be contacted with the lab results as soon as they are available. The fastest way to get your results is to activate your My Chart account. Instructions are located on the last page of this paperwork. If you have not heard from us regarding the results in 2 weeks, please contact this office.     Knee Effusion Knee effusion means that you have excess fluid in your knee joint. This can cause pain and swelling in your knee. This may make your knee more difficult to bend and move. That is because there is increased pain and pressure in the joint. If there is fluid in your knee, it often means that something is wrong inside your knee, such as severe arthritis, abnormal inflammation, or an infection. Another common cause of knee effusion is an injury to the knee muscles, ligaments, or cartilage. HOME CARE INSTRUCTIONS  Use crutches as directed by your health care provider.  Wear a knee brace as directed by your health care provider.  Apply ice to the swollen area:  Put ice in a plastic bag.  Place a towel between your skin and the bag.  Leave the ice on for 20 minutes, 2-3 times per day.  Keep your knee raised (elevated) when you are sitting or lying down.  Take medicines only as directed by your health care provider.  Do any rehabilitation or strengthening exercises as  directed by your health care provider.  Rest your knee as directed by your health care provider. You may start doing your normal activities again when your health care provider approves.   Keep all follow-up visits as directed by your health care provider. This is important. SEEK MEDICAL CARE IF:  You have ongoing (persistent) pain in your knee. SEEK IMMEDIATE MEDICAL CARE IF:  You have increased swelling or redness of your knee.  You have severe pain in your knee.  You have a fever.   This information is not intended to replace advice given to you by your health care provider. Make sure you discuss any questions you have with your health care provider.   Document Released: 02/21/2004 Document Revised: 12/22/2014 Document Reviewed: 07/17/2014 Elsevier Interactive Patient Education [employer]2016 Elsevier Inc.

## 2016-08-15 NOTE — Progress Notes (Signed)
   Jonathan Middleton  MRN: [id number] DOB: 05/12/1976  PCP: No PCP Per Patient  Subjective:  Pt is a 40 yo male, no reported medical history, presents to clinic for right knee pain x 2.5 weeks. 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 Gradual onset generalized knee pain. He says it was big and swollen last week. Pain is slightly better than when it started. He cannot bend his knee past 90 degrees due to "tight" feeling, not pain. Nothing makes it worse. Pain does not radiate.  NO MOI, trauma. Did not feel or hear pop/click/pain.  Tried ibuprofen, two pills twice a day. Icy Hot No history of knee injuries.  Denies fever, chills, a hot knee.   Review of Systems  Constitutional: Negative for appetite change, chills, diaphoresis, fatigue and fever.  Cardiovascular: Negative.   Gastrointestinal: Negative.   Genitourinary: Negative.   Musculoskeletal: Positive for arthralgias (right knee), gait problem (right knee pain) and joint swelling (right knee).  Skin: Negative for color change, pallor, rash and wound.  Neurological: Negative for dizziness, weakness, numbness and headaches.    There are no active problems to display for this patient.   No current outpatient prescriptions on file prior to visit.   No current facility-administered medications on file prior to visit.     No Known Allergies  Objective:  BP 122/82 (BP Location: Right Arm, Patient Position: Sitting, Cuff Size: Large)   Pulse 64   Temp 98.7 F (37.1 C)   Resp 18   Ht 5\' 9"  (1.753 m)   Wt 223 lb 6.4 oz (101.3 kg)   SpO2 97%   BMI 32.99 kg/m   Physical Exam  Constitutional: He is oriented to person, place, and time and well-developed, well-nourished, and in no distress. No distress.  Cardiovascular: Normal rate, regular rhythm and normal heart sounds.   Pulmonary/Chest: Effort normal. No respiratory distress.  Musculoskeletal:       Right knee: He exhibits decreased range of motion (tender when flexing past 90  degrees), swelling, effusion and abnormal meniscus (MacMurray's: minor tenderness to medial meniscus ). He exhibits no deformity, no erythema, no LCL laxity and no bony tenderness. No medial joint line, no lateral joint line, no MCL, no LCL and no patellar tendon tenderness noted.  Neurological: He is alert and oriented to person, place, and time. GCS score is 15.  Skin: Skin is warm and dry.  Psychiatric: Mood, memory, affect and judgment normal.  Vitals reviewed.   Assessment and Plan :  1. Effusion of bursa of knee, right - meloxicam (MOBIC) 15 MG tablet; Take 1 tablet (15 mg total) by mouth daily.  Dispense: 30 tablet; Refill: 1 2. Knee pain, acute, right  - Supportive therapy discussed with patient. Ice and Mobic. He will RTC if his pain is not better. Possible medial meniscus tear from his former sporting events. Consider imaging in the future if no improvement.   Marco CollieWhitney Duong Haydel, PA-C  [facility] 08/15/2016 8:49 AM

## 2016-12-29 ENCOUNTER — Ambulatory Visit (INDEPENDENT_AMBULATORY_CARE_PROVIDER_SITE_OTHER): Payer: BLUE CROSS/BLUE SHIELD

## 2016-12-29 ENCOUNTER — Ambulatory Visit (INDEPENDENT_AMBULATORY_CARE_PROVIDER_SITE_OTHER): Payer: BLUE CROSS/BLUE SHIELD | Admitting: Physician Assistant

## 2016-12-29 VITALS — BP 118/68 | HR 67 | Temp 98.6°F | Resp 16 | Ht 69.0 in | Wt 220.4 lb

## 2016-12-29 DIAGNOSIS — M25511 Pain in right shoulder: Secondary | ICD-10-CM

## 2016-12-29 DIAGNOSIS — S46911A Strain of unspecified muscle, fascia and tendon at shoulder and upper arm level, right arm, initial encounter: Secondary | ICD-10-CM

## 2016-12-29 DIAGNOSIS — Z23 Encounter for immunization: Secondary | ICD-10-CM

## 2016-12-29 MED ORDER — CYCLOBENZAPRINE HCL 10 MG PO TABS: 10.0000 mg | ORAL_TABLET | Freq: Three times a day (TID) | ORAL | 0 refills | Status: DC | PRN

## 2016-12-29 NOTE — Progress Notes (Signed)
[facility] [address] [place] [address] [phone]  Date:  12/29/2016   Name:  Jonathan Middleton   DOB:  08-May-1976   MRN:  [id number]  PCP:  No PCP Per Patient    History of Present Illness:  Jonathan Middleton is a 41 y.o. male patient who presents to [facility] for cc of shoulder pain.      -right shoulder pain started 2 weeks.  Initially thought he pulled it out.  Hurts constantly, but tolerable--described as aching.  Down at the deltoid and radiates to the back of the shoulder.  When he eraises his shoulder he feels the pain.  No known trauma.  No swelling.  Rotates neck to the right he can feel some tingling.  He took some ibuprofen 400mg  once per day in the morning.    There are no active problems to display for this patient.   Past Medical History:  Diagnosis Date  . Asthma     No past surgical history on file.  Social History  Substance Use Topics  . Smoking status: Never Smoker  . Smokeless tobacco: Never Used  . Alcohol use No    Family History  Problem Relation Age of Onset  . Heart disease Paternal Grandfather     No Known Allergies  Medication list has been reviewed and updated.  Current Outpatient Prescriptions on File Prior to Visit  Medication Sig Dispense Refill  . ibuprofen (ADVIL,MOTRIN) 200 MG tablet Take 200 mg by mouth every 6 (six) hours as needed for moderate pain.    . meloxicam (MOBIC) 15 MG tablet Take 1 tablet (15 mg total) by mouth daily. (Patient not taking: Reported on 12/29/2016) 30 tablet 1   No current facility-administered medications on file prior to visit.     ROS ROS otherwise unremarkable unless listed above.   Physical Examination: BP 118/68   Pulse 67   Temp 98.6 F (37 C) (Oral)   Resp 16   Ht 5\' 9"  (1.753 m)   Wt 220 lb 6.4 oz (100 kg)   SpO2 98%   BMI 32.55 kg/m  Ideal Body Weight: Weight in (lb) to have BMI = 25: 168.9  Physical Exam  Constitutional: He is oriented to person, place, and time.  He appears well-developed and well-nourished. No distress.  HENT:  Head: Normocephalic and atraumatic.  Eyes: Conjunctivae and EOM are normal. Pupils are equal, round, and reactive to light.  Cardiovascular: Normal rate.   Pulmonary/Chest: Effort normal. No respiratory distress.  Musculoskeletal:       Right shoulder: He exhibits no bony tenderness.       Cervical back: He exhibits no bony tenderness.       Thoracic back: He exhibits no bony tenderness.  Cervical range of motion normal. Pain incited with lateral deviation to the left side with pain located at the right shoulder consistent with his current complaint. Tender along the right upper posterior thoracic just lateral to the scapula and at the insertion of posterior rotator to shoulder/arm.     Neurological: He is alert and oriented to person, place, and time.  Skin: Skin is warm and dry. He is not diaphoretic.  Psychiatric: He has a normal mood and affect. His behavior is normal.   Dg Shoulder Right  Result Date: 12/29/2016 CLINICAL DATA:  Right shoulder pain for 2 weeks.  No known injury EXAM: RIGHT SHOULDER - 2+ VIEW COMPARISON:  None. FINDINGS: There is no evidence of fracture or  dislocation. There is no evidence of arthropathy or other focal bone abnormality. Soft tissues are unremarkable. IMPRESSION: Negative exam. Electronically Signed   By: Drusilla Kannerhomas  Dalessio M.D.   On: 12/29/2016 10:05      Assessment and Plan: Jonathan Middleton is a 41 y.o. male who is here today for cc of right shoulder pain. This appears to be a right shoulder muscle strain. Possibility of rotator cuff tear. Have given him shoulder exercises to perform. He will do this 3 times a day. Advised them to follow it up with icing for 15 minutes. Advised of precautions of the anti-inflammatory given. Also advised of precautions of the muscle relaxant prescribed. He has voiced understanding. Given work note for the next 2 days. Right shoulder strain, initial encounter  - Plan: cyclobenzaprine (FLEXERIL) 10 MG tablet  Need for prophylactic vaccination and inoculation against influenza - Plan: Flu Vaccine QUAD 36+ mos IM, cyclobenzaprine (FLEXERIL) 10 MG tablet  Acute pain of right shoulder - Plan: DG Shoulder Right, cyclobenzaprine (FLEXERIL) 10 MG tablet  Trena PlattStephanie Brenee Gajda, PA-C [facility] 1/17/201810:34 AM

## 2016-12-29 NOTE — Patient Instructions (Addendum)
Please ice the shoulder after performing these exercises. I would like you to choose 3 exercise and do them 3 times a day. That way, you'll end up icing your shoulder 3 times for 15 minutes. Please do not use the Mobic with naproxen or ibuprofen. You may use Tylenol. Please note that the Flexeril as a muscle relaxant and may cause sedation. Do not operate heavy machinery with this.  Shoulder Exercises Ask your health care provider which exercises are safe for you. Do exercises exactly as told by your health care provider and adjust them as directed. It is normal to feel mild stretching, pulling, tightness, or discomfort as you do these exercises, but you should stop right away if you feel sudden pain or your pain gets worse.Do not begin these exercises until told by your health care provider. RANGE OF MOTION EXERCISES  These exercises warm up your muscles and joints and improve the movement and flexibility of your shoulder. These exercises also help to relieve pain, numbness, and tingling. These exercises involve stretching your injured shoulder directly. Exercise A: Pendulum  1. Stand near a wall or a surface that you can hold onto for balance. 2. Bend at the waist and let your left / right arm hang straight down. Use your other arm to support you. Keep your back straight and do not lock your knees. 3. Relax your left / right arm and shoulder muscles, and move your hips and your trunk so your left / right arm swings freely. Your arm should swing because of the motion of your body, not because you are using your arm or shoulder muscles. 4. Keep moving your body so your arm swings in the following directions, as told by your health care provider:  Side to side.  Forward and backward.  In clockwise and counterclockwise circles. 5. Continue each motion for __________ seconds, or for as long as told by your health care provider. 6. Slowly return to the starting position. Repeat __________ times.  Complete this exercise __________ times a day. Exercise B:Flexion, Standing  1. Stand and hold a broomstick, a cane, or a similar object. Place your hands a little more than shoulder-width apart on the object. Your left / right hand should be palm-up, and your other hand should be palm-down. 2. Keep your elbow straight and keep your shoulder muscles relaxed. Push the stick down with your healthy arm to raise your left / right arm in front of your body, and then over your head until you feel a stretch in your shoulder.  Avoid shrugging your shoulder while you raise your arm. Keep your shoulder blade tucked down toward the middle of your back. 3. Hold for __________ seconds. 4. Slowly return to the starting position. Repeat __________ times. Complete this exercise __________ times a day. Exercise C: Abduction, Standing 1. Stand and hold a broomstick, a cane, or a similar object. Place your hands a little more than shoulder-width apart on the object. Your left / right hand should be palm-up, and your other hand should be palm-down. 2. While keeping your elbow straight and your shoulder muscles relaxed, push the stick across your body toward your left / right side. Raise your left / right arm to the side of your body and then over your head until you feel a stretch in your shoulder.  Do not raise your arm above shoulder height, unless your health care provider tells you to do that.  Avoid shrugging your shoulder while you raise your arm.  Keep your shoulder blade tucked down toward the middle of your back. 3. Hold for __________ seconds. 4. Slowly return to the starting position. Repeat __________ times. Complete this exercise __________ times a day. Exercise D:Internal Rotation  1. Place your left / right hand behind your back, palm-up. 2. Use your other hand to dangle an exercise band, a towel, or a similar object over your shoulder. Grasp the band with your left / right hand so you are holding  onto both ends. 3. Gently pull up on the band until you feel a stretch in the front of your left / right shoulder.  Avoid shrugging your shoulder while you raise your arm. Keep your shoulder blade tucked down toward the middle of your back. 4. Hold for __________ seconds. 5. Release the stretch by letting go of the band and lowering your hands. Repeat __________ times. Complete this exercise __________ times a day. STRETCHING EXERCISES  These exercises warm up your muscles and joints and improve the movement and flexibility of your shoulder. These exercises also help to relieve pain, numbness, and tingling. These exercises are done using your healthy shoulder to help stretch the muscles of your injured shoulder. Exercise E: Research officer, political party (External Rotation and Abduction)  1. Stand in a doorway with one of your feet slightly in front of the other. This is called a staggered stance. If you cannot reach your forearms to the door frame, stand facing a corner of a room. 2. Choose one of the following positions as told by your health care provider:  Place your hands and forearms on the door frame above your head.  Place your hands and forearms on the door frame at the height of your head.  Place your hands on the door frame at the height of your elbows. 3. Slowly move your weight onto your front foot until you feel a stretch across your chest and in the front of your shoulders. Keep your head and chest upright and keep your abdominal muscles tight. 4. Hold for __________ seconds. 5. To release the stretch, shift your weight to your back foot. Repeat __________ times. Complete this stretch __________ times a day. Exercise F:Extension, Standing 1. Stand and hold a broomstick, a cane, or a similar object behind your back.  Your hands should be a little wider than shoulder-width apart.  Your palms should face away from your back. 2. Keeping your elbows straight and keeping your shoulder muscles  relaxed, move the stick away from your body until you feel a stretch in your shoulder.  Avoid shrugging your shoulders while you move the stick. Keep your shoulder blade tucked down toward the middle of your back. 3. Hold for __________ seconds. 4. Slowly return to the starting position. Repeat __________ times. Complete this exercise __________ times a day. STRENGTHENING EXERCISES  These exercises build strength and endurance in your shoulder. Endurance is the ability to use your muscles for a long time, even after they get tired. Exercise G:External Rotation  1. Sit in a stable chair without armrests. 2. Secure an exercise band at elbow height on your left / right side. 3. Place a soft object, such as a folded towel or a small pillow, between your left / right upper arm and your body to move your elbow a few inches away (about 10 cm) from your side. 4. Hold the end of the band so it is tight and there is no slack. 5. Keeping your elbow pressed against the soft object, move  your left / right forearm out, away from your abdomen. Keep your body steady so only your forearm moves. 6. Hold for __________ seconds. 7. Slowly return to the starting position. Repeat __________ times. Complete this exercise __________ times a day. Exercise H:Shoulder Abduction  1. Sit in a stable chair without armrests, or stand. 2. Hold a __________ weight in your left / right hand, or hold an exercise band with both hands. 3. Start with your arms straight down and your left / right palm facing in, toward your body. 4. Slowly lift your left / right hand out to your side. Do not lift your hand above shoulder height unless your health care provider tells you that this is safe.  Keep your arms straight.  Avoid shrugging your shoulder while you do this movement. Keep your shoulder blade tucked down toward the middle of your back. 5. Hold for __________ seconds. 6. Slowly lower your arm, and return to the starting  position. Repeat __________ times. Complete this exercise __________ times a day. Exercise I:Shoulder Extension 1. Sit in a stable chair without armrests, or stand. 2. Secure an exercise band to a stable object in front of you where it is at shoulder height. 3. Hold one end of the exercise band in each hand. Your palms should face each other. 4. Straighten your elbows and lift your hands up to shoulder height. 5. Step back, away from the secured end of the exercise band, until the band is tight and there is no slack. 6. Squeeze your shoulder blades together as you pull your hands down to the sides of your thighs. Stop when your hands are straight down by your sides. Do not let your hands go behind your body. 7. Hold for __________ seconds. 8. Slowly return to the starting position. Repeat __________ times. Complete this exercise __________ times a day. Exercise J:Standing Shoulder Row 1. Sit in a stable chair without armrests, or stand. 2. Secure an exercise band to a stable object in front of you so it is at waist height. 3. Hold one end of the exercise band in each hand. Your palms should be in a thumbs-up position. 4. Bend each of your elbows to an "L" shape (about 90 degrees) and keep your upper arms at your sides. 5. Step back until the band is tight and there is no slack. 6. Slowly pull your elbows back behind you. 7. Hold for __________ seconds. 8. Slowly return to the starting position. Repeat __________ times. Complete this exercise __________ times a day. Exercise K:Shoulder Press-Ups  1. Sit in a stable chair that has armrests. Sit upright, with your feet flat on the floor. 2. Put your hands on the armrests so your elbows are bent and your fingers are pointing forward. Your hands should be about even with the sides of your body. 3. Push down on the armrests and use your arms to lift yourself off of the chair. Straighten your elbows and lift yourself up as much as you  comfortably can.  Move your shoulder blades down, and avoid letting your shoulders move up toward your ears.  Keep your feet on the ground. As you get stronger, your feet should support less of your body weight as you lift yourself up. 4. Hold for __________ seconds. 5. Slowly lower yourself back into the chair. Repeat __________ times. Complete this exercise __________ times a day. Exercise L: Wall Push-Ups  1. Stand so you are facing a stable wall. Your feet should be  about one arm-length away from the wall. 2. Lean forward and place your palms on the wall at shoulder height. 3. Keep your feet flat on the floor as you bend your elbows and lean forward toward the wall. 4. Hold for __________ seconds. 5. Straighten your elbows to push yourself back to the starting position. Repeat __________ times. Complete this exercise __________ times a day. This information is not intended to replace advice given to you by your health care provider. Make sure you discuss any questions you have with your health care provider. Document Released: 10/15/2005 Document Revised: 08/25/2016 Document Reviewed: 08/12/2015 Elsevier Interactive Patient Education  2017 [employer]     IF you received an x-ray today, you will receive an invoice from [facility]. Please contact [facility] at [phone] with questions or concerns regarding your invoice.   IF you received labwork today, you will receive an invoice from [facility]. Please contact [facility] at [phone] with questions or concerns regarding your invoice.   Our billing staff will not be able to assist you with questions regarding bills from these companies.  You will be contacted with the lab results as soon as they are available. The fastest way to get your results is to activate your My Chart account. Instructions are located on the last page of this paperwork. If you have not heard from us regarding the results in 2 weeks,  please contact this office.

## 2016-12-31 ENCOUNTER — Telehealth: Payer: Self-pay | Admitting: Physician Assistant

## 2016-12-31 MED ORDER — MELOXICAM 15 MG PO TABS: 15.0000 mg | ORAL_TABLET | Freq: Every day | ORAL | 0 refills | Status: DC

## 2016-12-31 NOTE — Telephone Encounter (Signed)
Please pull patient chart to review whether this was his right shoulder or left shoulder that he states had treatment in 2009.  Please leave in my box in the physician lounge.  Thanks! April of clerical should be able to assist if you are unsure what to do.

## 2016-12-31 NOTE — Telephone Encounter (Signed)
Please help.

## 2017-11-02 ENCOUNTER — Encounter: Payer: Self-pay | Admitting: Physician Assistant

## 2017-11-02 ENCOUNTER — Ambulatory Visit: Payer: BLUE CROSS/BLUE SHIELD | Admitting: Physician Assistant

## 2017-11-02 ENCOUNTER — Ambulatory Visit (INDEPENDENT_AMBULATORY_CARE_PROVIDER_SITE_OTHER): Payer: BLUE CROSS/BLUE SHIELD

## 2017-11-02 ENCOUNTER — Other Ambulatory Visit: Payer: Self-pay

## 2017-11-02 VITALS — BP 138/84 | HR 81 | Temp 98.3°F | Resp 16 | Ht 69.0 in | Wt 224.0 lb

## 2017-11-02 DIAGNOSIS — M25561 Pain in right knee: Secondary | ICD-10-CM | POA: Diagnosis not present

## 2017-11-02 MED ORDER — MELOXICAM 15 MG PO TABS: 15.0000 mg | ORAL_TABLET | Freq: Every day | ORAL | 0 refills | Status: DC

## 2017-11-02 NOTE — Progress Notes (Signed)
[facility] [address], [place] [address] [phone]  Date:  11/02/2017   Name:  Jonathan Gammondrian Cliett   DOB:  12/09/1976   MRN:  [id number]  PCP:  Patient, No Pcp Per    History of Present Illness:  Jonathan Middleton is a 41 y.o. male patient who presents to PCP with  Chief Complaint  Patient presents with  . Knee Pain    right/ 2 months.      2 months of right knee pain.  He has notived some swelling to the knee.  He did not injure it.  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  Knee feels stiff in the morning.  He is not elevating it.  He is icing the knee last night.  In the morning, takes 600mg , and lidocaine on the knee.    There are no active problems to display for this patient.   Past Medical History:  Diagnosis Date  . Asthma     History reviewed. No pertinent surgical history.  Social History   Tobacco Use  . Smoking status: Never Smoker  . Smokeless tobacco: Never Used  Substance Use Topics  . Alcohol use: No  . Drug use: No    Family History  Problem Relation Age of Onset  . Heart disease Paternal Grandfather     No Known Allergies  Medication list has been reviewed and updated.  Current Outpatient Medications on File Prior to Visit  Medication Sig Dispense Refill  . ibuprofen (ADVIL,MOTRIN) 200 MG tablet Take 200 mg by mouth every 6 (six) hours as needed for moderate pain.    . cyclobenzaprine (FLEXERIL) 10 MG tablet Take 1 tablet (10 mg total) by mouth 3 (three) times daily as needed for muscle spasms. (Patient not taking: Reported on 11/02/2017) 30 tablet 0  . meloxicam (MOBIC) 15 MG tablet Take 1 tablet (15 mg total) by mouth daily. (Patient not taking: Reported on 11/02/2017) 30 tablet 0   No current facility-administered medications on file prior to visit.     ROS ROS otherwise unremarkable unless listed above.  Physical Examination: BP 138/84   Pulse 81   Temp 98.3 F (36.8 C) (Oral)   Resp 16   Ht 5\' 9"  (1.753 m)   Wt 224 lb  (101.6 kg)   SpO2 98%   BMI 33.08 kg/m  Ideal Body Weight: Weight in (lb) to have BMI = 25: 168.9  Physical Exam  Constitutional: He is oriented to person, place, and time. He appears well-developed and well-nourished. No distress.  HENT:  Head: Normocephalic and atraumatic.  Eyes: Conjunctivae and EOM are normal. Pupils are equal, round, and reactive to light.  Cardiovascular: Normal rate.  Pulmonary/Chest: Effort normal. No respiratory distress.  Musculoskeletal:       Right knee: He exhibits decreased range of motion (tender at the medial area of the knee to the patella.  neg anterior drawer test.  ). He exhibits no swelling. Tenderness found. Medial joint line tenderness noted. No lateral joint line, no MCL, no LCL and no patellar tendon tenderness noted.  Neurological: He is alert and oriented to person, place, and time.  Skin: Skin is warm and dry. He is not diaphoretic.  Psychiatric: He has a normal mood and affect. His behavior is normal.   Dg Knee Complete 4 Views Right  Result Date: 11/02/2017 CLINICAL DATA:  Right knee pain for the past month. EXAM: RIGHT KNEE - COMPLETE 4+ VIEW COMPARISON:  None. FINDINGS: Minimal spurring  off the mesial aspect of the medial femoral condyle as well as off the upper pole the patella. Minimal spurring off the tibial spines. 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 Slight joint space narrowing of the femorotibial and patellofemoral compartments. No fracture, suspicious osseous lesion or significant joint effusion. No soft tissue mass or mineralization. IMPRESSION: Degenerative spurring of the medial femoral condyle and patella with slight joint space narrowing. No acute osseous abnormality. Electronically Signed   By: Tollie Ethavid  Kwon M.D.   On: 11/02/2017 15:28      Assessment and Plan: Jonathan Gammondrian Petion is a 41 y.o. male who is here today for cc of right knee pain. Anti-inflammatory and brace given.   Advised patient to ice, rest, and  elevate the knee at this time.   Acute pain of right knee - Plan: DG Knee Complete 4 Views Right  Trena PlattStephanie Lemya Greenwell, PA-C [facility] 11/25/20184:04 PM

## 2017-11-02 NOTE — Patient Instructions (Addendum)
  I would like you to ice this knee three times per day for 15 minutes.   Please try to wear the brace when working and performing bending and stooping activities.  Be careful with stepping out of the vehicle, not putting a heavy sudden load on your right leg.  Do not take mobic with naproxen or ibuprofen.  You can take tylenol.    IF you received an x-ray today, you will receive an invoice from [facility]. Please contact [facility] at [phone] with questions or concerns regarding your invoice.   IF you received labwork today, you will receive an invoice from [facility]. Please contact [facility] at [phone] with questions or concerns regarding your invoice.   Our billing staff will not be able to assist you with questions regarding bills from these companies.  You will be contacted with the lab results as soon as they are available. The fastest way to get your results is to activate your My Chart account. Instructions are located on the last page of this paperwork. If you have not heard from us regarding the results in 2 weeks, please contact this office.

## 2017-11-02 NOTE — Progress Notes (Deleted)
[facility] [address], [place] [address] [phone]  Date:  11/02/2017   Name:  Jonathan Middleton   DOB:  06/19/1976   MRN:  [id number]  PCP:  Patient, No Pcp Per    History of Present Illness:  Jonathan Middleton is a 41 y.o. male patient who presents to PCP with  Chief Complaint  Patient presents with  . Knee Pain    right/ 2 months.      Knee pain for 2 months at right knee.   There are no active problems to display for this patient.   Past Medical History:  Diagnosis Date  . Asthma     History reviewed. No pertinent surgical history.  Social History   Tobacco Use  . Smoking status: Never Smoker  . Smokeless tobacco: Never Used  Substance Use Topics  . Alcohol use: No  . Drug use: No    Family History  Problem Relation Age of Onset  . Heart disease Paternal Grandfather     No Known Allergies  Medication list has been reviewed and updated.  Current Outpatient Medications on File Prior to Visit  Medication Sig Dispense Refill  . ibuprofen (ADVIL,MOTRIN) 200 MG tablet Take 200 mg by mouth every 6 (six) hours as needed for moderate pain.    . cyclobenzaprine (FLEXERIL) 10 MG tablet Take 1 tablet (10 mg total) by mouth 3 (three) times daily as needed for muscle spasms. (Patient not taking: Reported on 11/02/2017) 30 tablet 0  . meloxicam (MOBIC) 15 MG tablet Take 1 tablet (15 mg total) by mouth daily. (Patient not taking: Reported on 11/02/2017) 30 tablet 0   No current facility-administered medications on file prior to visit.     ROS ROS otherwise unremarkable unless listed above.  Physical Examination: BP 138/84   Pulse 81   Temp 98.3 F (36.8 C) (Oral)   Resp 16   Ht 5\' 9"  (1.753 m)   Wt 224 lb (101.6 kg)   SpO2 98%   BMI 33.08 kg/m  Ideal Body Weight: Weight in (lb) to have BMI = 25: 168.9  Physical Exam  Dg Knee Complete 4 Views Right  Result Date: 11/02/2017 CLINICAL DATA:  Right knee pain for the past month. EXAM: RIGHT  KNEE - COMPLETE 4+ VIEW COMPARISON:  None. FINDINGS: Minimal spurring off the mesial aspect of the medial femoral condyle as well as off the upper pole the patella. Minimal spurring off the tibial spines. 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 Slight joint Middleton narrowing of the femorotibial and patellofemoral compartments. No fracture, suspicious osseous lesion or significant joint effusion. No soft tissue mass or mineralization. IMPRESSION: Degenerative spurring of the medial femoral condyle and patella with slight joint Middleton narrowing. No acute osseous abnormality. Electronically Signed   By: Tollie Ethavid  Kwon M.D.   On: 11/02/2017 15:28     Assessment and Plan: Jonathan Middleton is a 41 y.o. male who is here today  There are no diagnoses linked to this encounter.  Trena PlattStephanie Daren Yeagle, PA-C [facility] 11/02/2017 2:44 PM

## 2018-02-15 ENCOUNTER — Other Ambulatory Visit: Payer: Self-pay

## 2018-02-15 ENCOUNTER — Encounter: Payer: Self-pay | Admitting: Physician Assistant

## 2018-02-15 ENCOUNTER — Ambulatory Visit: Payer: BLUE CROSS/BLUE SHIELD | Admitting: Physician Assistant

## 2018-02-15 VITALS — BP 116/77 | HR 85 | Resp 16 | Ht 69.0 in | Wt 222.6 lb

## 2018-02-15 DIAGNOSIS — B356 Tinea cruris: Secondary | ICD-10-CM

## 2018-02-15 DIAGNOSIS — L298 Other pruritus: Secondary | ICD-10-CM | POA: Diagnosis not present

## 2018-02-15 MED ORDER — TERBINAFINE HCL 250 MG PO TABS: 250.0000 mg | ORAL_TABLET | Freq: Every day | ORAL | 0 refills | Status: DC

## 2018-02-15 NOTE — Progress Notes (Signed)
    02/15/2018 2:57 PM   DOB: 12/03/1976 / MRN: [id number]  SUBJECTIVE:  Jonathan Middleton is a 42 y.o. male presenting for rash in the bilateral inguinal area that is itching.  He has tried tea tree oil ,jock itch lotion, he denies a history of diabetes.  And powders at work.  He states the powders help the most.   He has No Known Allergies.   He  has a past medical history of Asthma.    He  reports that  has never smoked. he has never used smokeless tobacco. He reports that he does not drink alcohol or use drugs. He  has no sexual activity history on file. The patient  has no past surgical history on file.  His family history includes Heart disease in his paternal grandfather.  ROS As per HPI otherwise negative  The problem list and medications were reviewed and updated by myself where necessary and exist elsewhere in the encounter.   OBJECTIVE:  BP 116/77 (BP Location: Right Arm, Patient Position: Sitting, Cuff Size: Large)   Pulse 85   Resp 16   Ht 5\' 9"  (1.753 m)   Wt 222 lb 9.6 oz (101 kg)   SpO2 98%   BMI 32.87 kg/m     Physical Exam  Constitutional: He appears well-developed. He is active and cooperative.  Non-toxic appearance.  Cardiovascular: Normal rate.  Pulmonary/Chest: Effort normal. No tachypnea.  Neurological: He is alert.  Skin: Skin is warm and dry. He is not diaphoretic. No pallor.     Vitals reviewed.   No results found for this or any previous visit (from the past 72 hour(s)).  No results found.  ASSESSMENT AND PLAN:  Jonathan Middleton was seen today for rash.  Diagnoses and all orders for this visit:  Jock itch: Advised that he keep the rash dry with clotrimazole spray powder.  If this fails he can start the terbinafine 250 for 2 weeks and complete the course. -     Hepatic Function Panel -     Hemoglobin A1c  Other orders -     terbinafine (LAMISIL) 250 MG tablet; Take 1 tablet (250 mg total) by mouth daily.    The patient is advised to call or  return to clinic if he does not see an improvement in symptoms, or to seek the care of the closest emergency department if he worsens with the above plan.   Deliah BostonMichael Sipriano Fendley, MHS, PA-C Primary Care at [facility] 02/15/2018 2:57 PM

## 2018-02-15 NOTE — Patient Instructions (Addendum)
  Purchase some over the counter clotrimazole dry powder spray and spray the afected area morning and night. If you get better then you don't need to take meds. If in ten days you are not getting better then start the terbinafine.    IF you received an x-ray today, you will receive an invoice from [facility]. Please contact [facility] at [phone] with questions or concerns regarding your invoice.   IF you received labwork today, you will receive an invoice from [facility]. Please contact [facility] at [phone] with questions or concerns regarding your invoice.   Our billing staff will not be able to assist you with questions regarding bills from these companies.  You will be contacted with the lab results as soon as they are available. The fastest way to get your results is to activate your My Chart account. Instructions are located on the last page of this paperwork. If you have not heard from us regarding the results in 2 weeks, please contact this office.

## 2018-02-16 LAB — HEPATIC FUNCTION PANEL
ALT: 35 IU/L (ref 0–44)
AST: 21 IU/L (ref 0–40)
Albumin: 4.5 g/dL (ref 3.5–5.5)
Alkaline Phosphatase: 83 IU/L (ref 39–117)
BILIRUBIN TOTAL: 0.5 mg/dL (ref 0.0–1.2)
Bilirubin, Direct: 0.13 mg/dL (ref 0.00–0.40)
TOTAL PROTEIN: 7.6 g/dL (ref 6.0–8.5)

## 2018-02-16 LAB — HEMOGLOBIN A1C
Est. average glucose Bld gHb Est-mCnc: 114 mg/dL
HEMOGLOBIN A1C: 5.6 % (ref 4.8–5.6)

## 2018-02-19 ENCOUNTER — Ambulatory Visit: Payer: BLUE CROSS/BLUE SHIELD | Admitting: Physician Assistant

## 2018-02-19 ENCOUNTER — Encounter: Payer: Self-pay | Admitting: Physician Assistant

## 2018-02-19 ENCOUNTER — Telehealth: Payer: Self-pay | Admitting: Physician Assistant

## 2018-02-19 VITALS — BP 133/83 | HR 77 | Temp 98.2°F | Resp 17 | Ht 69.5 in | Wt 228.0 lb

## 2018-02-19 DIAGNOSIS — L731 Pseudofolliculitis barbae: Secondary | ICD-10-CM | POA: Diagnosis not present

## 2018-02-19 NOTE — Patient Instructions (Signed)
     IF you received an x-ray today, you will receive an invoice from [facility]. Please contact [facility] at [phone] with questions or concerns regarding your invoice.   IF you received labwork today, you will receive an invoice from [facility]. Please contact [facility] at [phone] with questions or concerns regarding your invoice.   Our billing staff will not be able to assist you with questions regarding bills from these companies.  You will be contacted with the lab results as soon as they are available. The fastest way to get your results is to activate your My Chart account. Instructions are located on the last page of this paperwork. If you have not heard from us regarding the results in 2 weeks, please contact this office.     

## 2018-02-19 NOTE — Progress Notes (Signed)
    02/19/2018 10:02 AM   DOB: 10/11/1976 / MRN: [id number]  SUBJECTIVE:  Jonathan Middleton is a 42 y.o. male presenting for patient here today for paperwork to be signed.  States that when he shaves with a razor he gets "bumps."  These can be painful and do become infected from time to time.  He has No Known Allergies.   He  has a past medical history of Asthma.    He  reports that  has never smoked. he has never used smokeless tobacco. He reports that he does not drink alcohol or use drugs. He  reports that he does not engage in sexual activity. The patient  has no past surgical history on file.  His family history includes Heart disease in his paternal grandfather.  Review of Systems  Constitutional: Negative for chills, diaphoresis and fever.  Gastrointestinal: Negative for nausea.  Skin: Negative for rash.  Neurological: Negative for dizziness.    The problem list and medications were reviewed and updated by myself where necessary and exist elsewhere in the encounter.   OBJECTIVE:  BP 133/83   Pulse 77   Temp 98.2 F (36.8 C) (Oral)   Resp 17   Ht 5' 9.5" (1.765 m)   Wt 228 lb (103.4 kg)   SpO2 98%   BMI 33.19 kg/m   Physical Exam  Constitutional: He appears well-developed. He is active and cooperative.  Non-toxic appearance.  HENT:  Right Ear: Hearing, tympanic membrane, external ear and ear canal normal.  Left Ear: Hearing, tympanic membrane, external ear and ear canal normal.  Nose: Nose normal. Right sinus exhibits no maxillary sinus tenderness and no frontal sinus tenderness. Left sinus exhibits no maxillary sinus tenderness and no frontal sinus tenderness.  Mouth/Throat: Uvula is midline, oropharynx is clear and moist and mucous membranes are normal. No oropharyngeal exudate, posterior oropharyngeal edema or tonsillar abscesses.  Eyes: Conjunctivae are normal. Pupils are equal, round, and reactive to light.  Cardiovascular: Normal rate.  Pulmonary/Chest: Effort  normal. No tachypnea.  Lymphadenopathy:       Head (right side): No submandibular and no tonsillar adenopathy present.       Head (left side): No submandibular and no tonsillar adenopathy present.    He has no cervical adenopathy.  Neurological: He is alert.  Skin: Skin is warm and dry. He is not diaphoretic. No pallor.  Vitals reviewed.      No results found for this or any previous visit (from the past 72 hour(s)).  No results found.  ASSESSMENT AND PLAN:  Koleen Nimroddrian was seen today for follow up.  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  He will come back as needed.    The patient is advised to call or return to clinic if he does not see an improvement in symptoms, or to seek the care of the closest emergency department if he worsens with the above plan.   Deliah BostonMichael Clark, MHS, PA-C [facility] at [facility] 02/19/2018 10:02 AM

## 2018-02-19 NOTE — Telephone Encounter (Signed)
Copied from CRM [id number]. Topic: Inquiry >> Feb 19, 2018  1:27 PM Maia Pettiesrtiz, Kristie S wrote: Reason for CRM: pt called in requesting an RX for the bumps on his neck that he was seen for today by Deliah BostonMichael Clark, PA - please call to notify pt  [facility] [phone] - [place] - [id number] [facility] [address] [facility] [address] [phone] (Phone) [phone] (Fax)

## 2018-02-20 NOTE — Telephone Encounter (Signed)
Otc 10% Benzoil Peroxide Clean and Clear wash. 1 time daily in the shower.

## 2018-02-20 NOTE — Telephone Encounter (Signed)
Please advise 

## 2018-02-22 NOTE — Telephone Encounter (Signed)
Phone call to patient. Relayed message from provider. He is agreeable, verbalizes understanding.

## 2018-03-15 ENCOUNTER — Encounter: Payer: Self-pay | Admitting: Physician Assistant

## 2018-03-20 ENCOUNTER — Ambulatory Visit: Payer: BLUE CROSS/BLUE SHIELD | Admitting: Physician Assistant

## 2018-03-29 ENCOUNTER — Telehealth: Payer: Self-pay | Admitting: Physician Assistant

## 2018-03-29 NOTE — Telephone Encounter (Signed)
Called pt to see if I could rescheduled his missed appt from 03/20/18. When I spoke with pt, he said that he had already taken care of it. He would call if he needed us.

## 2020-07-27 ENCOUNTER — Encounter (HOSPITAL_COMMUNITY): Payer: Self-pay

## 2020-07-27 ENCOUNTER — Other Ambulatory Visit: Payer: Self-pay

## 2020-07-27 ENCOUNTER — Ambulatory Visit: Admission: EM | Admit: 2020-07-27 | Discharge: 2020-07-27 | Discharge status: Against Medical Advice | Payer: Self-pay

## 2020-07-27 ENCOUNTER — Ambulatory Visit (HOSPITAL_COMMUNITY)
Admission: EM | Admit: 2020-07-27 | Discharge: 2020-07-27 | Disposition: A | Discharge status: Home / Self Care | Payer: Worker's Compensation | Attending: Urgent Care | Admitting: Urgent Care

## 2020-07-27 DIAGNOSIS — M545 Low back pain, unspecified: Secondary | ICD-10-CM

## 2020-07-27 DIAGNOSIS — M25511 Pain in right shoulder: Secondary | ICD-10-CM

## 2020-07-27 DIAGNOSIS — S39012A Strain of muscle, fascia and tendon of lower back, initial encounter: Secondary | ICD-10-CM

## 2020-07-27 MED ORDER — NAPROXEN 500 MG PO TABS: 500.0000 mg | ORAL_TABLET | Freq: Two times a day (BID) | ORAL | 0 refills | Status: DC

## 2020-07-27 MED ORDER — TIZANIDINE HCL 4 MG PO TABS: 4.0000 mg | ORAL_TABLET | Freq: Three times a day (TID) | ORAL | 0 refills | Status: DC | PRN
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
## 2020-08-08 ENCOUNTER — Emergency Department (HOSPITAL_COMMUNITY)
Admission: EM | Admit: 2020-08-08 | Discharge: 2020-08-08 | Disposition: A | Discharge status: Home / Self Care | Payer: BC Managed Care – PPO | Attending: Emergency Medicine | Admitting: Emergency Medicine

## 2020-08-08 ENCOUNTER — Emergency Department (HOSPITAL_COMMUNITY): Payer: BC Managed Care – PPO

## 2020-08-08 ENCOUNTER — Encounter (HOSPITAL_COMMUNITY): Payer: Self-pay | Admitting: Emergency Medicine

## 2020-08-08 DIAGNOSIS — Y9289 Other specified places as the place of occurrence of the external cause: Secondary | ICD-10-CM | POA: Diagnosis not present

## 2020-08-08 DIAGNOSIS — M62838 Other muscle spasm: Secondary | ICD-10-CM | POA: Insufficient documentation

## 2020-08-08 DIAGNOSIS — M545 Low back pain, unspecified: Secondary | ICD-10-CM

## 2020-08-08 DIAGNOSIS — Y9389 Activity, other specified: Secondary | ICD-10-CM | POA: Insufficient documentation

## 2020-08-08 DIAGNOSIS — Y998 Other external cause status: Secondary | ICD-10-CM | POA: Diagnosis not present

## 2020-08-08 DIAGNOSIS — J45909 Unspecified asthma, uncomplicated: Secondary | ICD-10-CM | POA: Diagnosis not present

## 2020-08-08 MED ORDER — LIDOCAINE 5 % EX PTCH
2.0000 | MEDICATED_PATCH | CUTANEOUS | Status: DC
  Administered 2020-08-08: 14:00:00 1 via TRANSDERMAL
  Filled 2020-08-08: qty 2

## 2020-08-08 MED ORDER — PREDNISONE 10 MG (21) PO TBPK: ORAL_TABLET | Freq: Every day | ORAL | 0 refills | Status: DC

## 2020-08-08 MED ORDER — CYCLOBENZAPRINE HCL 10 MG PO TABS: 10.0000 mg | ORAL_TABLET | Freq: Two times a day (BID) | ORAL | 0 refills | Status: DC | PRN

## 2020-08-08 MED ORDER — PREDNISONE 20 MG PO TABS
60.0000 mg | ORAL_TABLET | Freq: Once | ORAL | Status: AC
  Administered 2020-08-08: 14:00:00 60 mg via ORAL
  Filled 2020-08-08: qty 3

## 2020-08-08 MED ORDER — LIDOCAINE 4 % EX CREA: 1.0000 "application " | TOPICAL_CREAM | Freq: Three times a day (TID) | CUTANEOUS | 0 refills | Status: DC | PRN
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
## 2020-08-08 NOTE — Discharge Instructions (Addendum)
Your x-ray showed some mild arthritis but no evidence of fracture or other serious injury.  Start taking steroid taper as prescribed.  Do not take ibuprofen, Advil, Aleve, or Motrin while taking this medication.  Do not take naproxen while taking this medication.  You may take 1 to 2 tablets of extra strength Tylenol every 6 hours as needed for pain additionally.  Do not exceed 4000 mg of Tylenol daily.  You can apply lidocaine cream to areas of pain as prescribed.  You may take cyclobenzaprine which is a muscle relaxant up to twice daily as needed for muscle spasms.  Do not take tizanidine if you start taking cyclobenzaprine.  This medication may make you drowsy, so I typically only recommended only taking at night. If this medication makes you drowsy throughout the day, no driving, drinking alcohol, or operating heavy machinery. You may also cut these tablets in half if they are too strong.   Apply ice or heat, whichever feels best, to areas of soreness 20 minutes at a time 3-4 times daily.  Do some gentle stretching throughout the day, especially during or after hot showers or baths. Take short frequent walks and avoid prolonged periods of sitting or laying.   Expect to be sore for the next few day and follow up with primary care physician for recheck of ongoing symptoms (especially if symptoms persist longer than 1 week) but return to ER for emergent changing or worsening of symptoms such as severe headache that gets worse, altered mental status/behaving unusually, persistent vomiting, excessive drowsiness, numbness to the arms or legs, unsteady gait, or slurred speech.
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
## 2021-08-05 NOTE — Progress Notes (Signed)
History of Present Illness: 45 year old male self-referred for follow-up of penile discharge.  The patient states that in July of this year he noticed, in the morning upon waking, crusty material in his urethral meatus.  Since that time, when squeezing his penis, he has had some purulent material come out.  He was seen at [facility] in [place] on 2 separate occasions, in July and this month.  He was given Macrodantin both times.  STD evaluation was done including gonorrhea, chlamydia and treponema.  They were negative.  He has a solitary sexual partner.  No recent new partners.  He does not use protection.  No prior history of STDs.  He still has this purulent matter.  Past Medical History:  Diagnosis Date   Asthma     No past surgical history on file.  Home Medications:  Allergies as of 08/06/2021   No Known Allergies      Medication List        Accurate as of August 05, 2021  1:19 PM. If you have any questions, ask your nurse or doctor.          cyclobenzaprine 10 MG tablet Commonly known as: FLEXERIL Take 1 tablet (10 mg total) by mouth 2 (two) times daily as needed for muscle spasms.   lidocaine 4 % cream Commonly known as: LMX Apply 1 application topically 3 (three) times daily as needed.   predniSONE 10 MG (21) Tbpk tablet Commonly known as: STERAPRED UNI-PAK 21 TAB Take by mouth daily. Take 6 tabs by mouth daily  for 2 days, then 5 tabs for 2 days, then 4 tabs for 2 days, then 3 tabs for 2 days, 2 tabs for 2 days, then 1 tab by mouth daily for 2 days   terbinafine 250 MG tablet Commonly known as: LAMISIL Take 1 tablet (250 mg total) by mouth daily.        Allergies: No Known Allergies  Family History  Problem Relation Age of Onset   Heart disease Paternal Grandfather     Social History:  reports that he has never smoked. He has never used smokeless tobacco. He reports that he does not drink alcohol and does not use drugs.  ROS: A  complete review of systems was performed.  All systems are negative except for pertinent findings as noted.  Physical Exam:  Vital signs in last 24 hours: There were no vitals taken for this visit. Constitutional:  Alert and oriented, No acute distress Cardiovascular: Regular rate  Respiratory: Normal respiratory effort Genitourinary: Normal male phallus, testes are descended bilaterally and non-tender and without masses, scrotum is normal in appearance without lesions or masses, perineum is normal on inspection.  No blood or purulent material at his meatus. Lymphatic: No lymphadenopathy Neurologic: Grossly intact, no focal deficits Psychiatric: Normal mood and affect  I have reviewed prior pt notes--[facility]  I have reviewed urinalysis result  I have reviewed prior urine culture   Impression/Assessment:  Urethral discharge with negative prior STD testing.  However, patient was not given proper antibiotic  Plan:  1.  I will send his urine for amplification studies for gonorrhea and chlamydia  2.  I will put him on a 5-day course of Cipro

## 2021-08-06 ENCOUNTER — Other Ambulatory Visit: Payer: Self-pay

## 2021-08-06 ENCOUNTER — Encounter: Payer: Self-pay | Admitting: Urology

## 2021-08-06 ENCOUNTER — Ambulatory Visit (INDEPENDENT_AMBULATORY_CARE_PROVIDER_SITE_OTHER): Payer: BC Managed Care – PPO | Admitting: Urology

## 2021-08-06 VITALS — BP 157/100 | HR 80

## 2021-08-06 DIAGNOSIS — R369 Urethral discharge, unspecified: Secondary | ICD-10-CM | POA: Diagnosis not present

## 2021-08-06 LAB — URINALYSIS, ROUTINE W REFLEX MICROSCOPIC
Bilirubin, UA: NEGATIVE
Glucose, UA: NEGATIVE
Ketones, UA: NEGATIVE
Nitrite, UA: NEGATIVE
Protein,UA: NEGATIVE
Specific Gravity, UA: 1.02 (ref 1.005–1.030)
Urobilinogen, Ur: 0.2 mg/dL (ref 0.2–1.0)
pH, UA: 5.5 (ref 5.0–7.5)

## 2021-08-06 LAB — MICROSCOPIC EXAMINATION
Bacteria, UA: NONE SEEN
Renal Epithel, UA: NONE SEEN /hpf

## 2021-08-06 MED ORDER — CIPROFLOXACIN HCL 250 MG PO TABS: 250.0000 mg | ORAL_TABLET | Freq: Two times a day (BID) | ORAL | 0 refills | Status: DC

## 2021-08-06 NOTE — Progress Notes (Signed)
Urological Symptom Review  Patient is experiencing the following symptoms: Frequent urination Penile discharge   Review of Systems  Gastrointestinal (upper)  : Negative for upper GI symptoms  Gastrointestinal (lower) : Negative for lower GI symptoms  Constitutional : Negative for symptoms  Skin: Negative for skin symptoms  Eyes: Negative for eye symptoms  Ear/Nose/Throat : Negative for Ear/Nose/Throat symptoms  Hematologic/Lymphatic: Negative for Hematologic/Lymphatic symptoms  Cardiovascular : Negative for cardiovascular symptoms  Respiratory : Negative for respiratory symptoms  Endocrine: Negative for endocrine symptoms  Musculoskeletal: Negative for musculoskeletal symptoms  Neurological: Negative for neurological symptoms  Psychologic: Negative for psychiatric symptoms

## 2021-08-09 LAB — CT NG M GENITALIUM NAA, URINE
Chlamydia trachomatis, NAA: NEGATIVE
Mycoplasma genitalium NAA: POSITIVE — AB
Neisseria gonorrhoeae, NAA: NEGATIVE

## 2021-08-13 ENCOUNTER — Telehealth: Payer: Self-pay

## 2021-08-13 NOTE — Telephone Encounter (Signed)
Patient returned call to office and results discussed as well as instructions for partner. Pt voiced understanding.

## 2021-08-13 NOTE — Telephone Encounter (Signed)
Patient called- no answer. Left message to call back to office.

## 2021-08-13 NOTE — Telephone Encounter (Signed)
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  He needs to tell his the partner to get checked.  Should not have intercourse until they have been checked.   ----- Message ----- From: Ferdinand Lango, RN Sent: 08/09/2021   1:46 PM EDT To: Marcine Matar, MD  Please review

## 2021-09-17 ENCOUNTER — Ambulatory Visit: Payer: BC Managed Care – PPO | Admitting: Urology

## 2021-10-22 ENCOUNTER — Telehealth: Payer: Self-pay

## 2021-10-22 NOTE — Telephone Encounter (Signed)
Patient called and advised that he needed a refill on medication  Medication: ciprofloxacin (CIPRO) 250 MG tablet  Pharmacy: [id number] - [place],  - [address] AT [facility] OF [address] & [address]

## 2021-10-23 NOTE — Telephone Encounter (Signed)
Please advise= treated last time for STI

## 2021-10-26 NOTE — Progress Notes (Signed)
History of Present Illness: Pt presents for recurrent urethral sx's.  8.23.2022: 45 year old male self-referred for follow-up of penile discharge.  The patient states that in July of this year he noticed, in the morning upon waking, crusty material in his urethral meatus.  Since that time, when squeezing his penis, he has had some purulent material come out.  He was seen at [facility] in [place] on 2 separate occasions, in July and this month.  He was given Macrodantin both times.  STD evaluation was done including gonorrhea, chlamydia and treponema.  They were negative.  He has a solitary sexual partner.  No recent new partners.  He does not use protection.  No prior history of STDs.  Urine screen + for mycoplasma. Rx'ed w/ Cipro.  11.15.2022: Here for recheck.  He wonders whether he has recurrence of his STD.  He states that once in the past 2 weeks he has had purulent drainage from his penis.  His main issue is that he urinated into a glass container 2 weeks ago and saw some floating material, and he wonders whether he has this recurrence.   Past Medical History:  Diagnosis Date   Asthma     No past surgical history on file.  Home Medications:  Allergies as of 10/29/2021   No Known Allergies      Medication List        Accurate as of October 26, 2021  4:41 PM. If you have any questions, ask your nurse or doctor.          ciprofloxacin 250 MG tablet Commonly known as: Cipro Take 1 tablet (250 mg total) by mouth 2 (two) times daily.        Allergies: No Known Allergies  Family History  Problem Relation Age of Onset   Heart disease Paternal Grandfather     Social History:  reports that he has never smoked. He has never used smokeless tobacco. He reports that he does not drink alcohol and does not use drugs.  ROS: A complete review of systems was performed.  All systems are negative except for pertinent findings as noted.  Physical Exam:  Vital  signs in last 24 hours: There were no vitals taken for this visit. Constitutional:  Alert and oriented, No acute distress Cardiovascular: Regular rate  Respiratory: Normal respiratory effort Genitourinary: Normal male phallus, testes are descended bilaterally and non-tender and without masses, scrotum is normal in appearance without lesions or masses, perineum is normal on inspection.  There is no penile discharge. Lymphatic: No lymphadenopathy Neurologic: Grossly intact, no focal deficits Psychiatric: Normal mood and affect I have reviewed prior pt notes  I have reviewed notes from referring/previous physicians  I have reviewed urinalysis results  I have reviewed prior urine culture    Impression/Assessment:  Rule out recurrent STD  Plan:  1.  I gave him 7 days worth of doxycycline  2.  I will send urine for check of chlamydia, gonorrhea and mycoplasma.

## 2021-10-28 NOTE — Telephone Encounter (Signed)
Patient scheduled 11/15 to see MD.

## 2021-10-29 ENCOUNTER — Ambulatory Visit (INDEPENDENT_AMBULATORY_CARE_PROVIDER_SITE_OTHER): Payer: BC Managed Care – PPO | Admitting: Urology

## 2021-10-29 ENCOUNTER — Other Ambulatory Visit: Payer: Self-pay

## 2021-10-29 ENCOUNTER — Encounter: Payer: Self-pay | Admitting: Urology

## 2021-10-29 VITALS — BP 146/89 | HR 102 | Temp 98.4°F

## 2021-10-29 DIAGNOSIS — R369 Urethral discharge, unspecified: Secondary | ICD-10-CM | POA: Diagnosis not present

## 2021-10-29 MED ORDER — DOXYCYCLINE HYCLATE 100 MG PO TABS: 100.0000 mg | ORAL_TABLET | Freq: Two times a day (BID) | ORAL | 0 refills | Status: DC

## 2021-10-29 NOTE — Addendum Note (Signed)
Addended byGustavus Messing on: 10/29/2021 02:37 PM   Modules accepted: Orders

## 2021-10-29 NOTE — Progress Notes (Signed)
Urological Symptom Review ° °Patient is experiencing the following symptoms: °Urinary tract infection ° ° °Review of Systems ° °Gastrointestinal (upper)  : °Negative for upper GI symptoms ° °Gastrointestinal (lower) : °Negative for lower GI symptoms ° °Constitutional : °Negative for symptoms ° °Skin: °Negative for skin symptoms ° °Eyes: °Negative for eye symptoms ° °Ear/Nose/Throat : °Negative for Ear/Nose/Throat symptoms ° °Hematologic/Lymphatic: °Negative for Hematologic/Lymphatic symptoms ° °Cardiovascular : °Negative for cardiovascular symptoms ° °Respiratory : °Negative for respiratory symptoms ° °Endocrine: °Negative for endocrine symptoms ° °Musculoskeletal: °Negative for musculoskeletal symptoms ° °Neurological: °Negative for neurological symptoms ° °Psychologic: °Negative for psychiatric symptoms °

## 2021-10-30 LAB — URINALYSIS, ROUTINE W REFLEX MICROSCOPIC
Bilirubin, UA: NEGATIVE
Glucose, UA: NEGATIVE
Ketones, UA: NEGATIVE
Leukocytes,UA: NEGATIVE
Nitrite, UA: NEGATIVE
Protein,UA: NEGATIVE
Specific Gravity, UA: >1.03 — ABNORMAL HIGH (ref 1.005–1.030)
Urobilinogen, Ur: 0.2 mg/dL (ref 0.2–1.0)
pH, UA: 5.5 (ref 5.0–7.5)

## 2021-10-30 LAB — MICROSCOPIC EXAMINATION

## 2021-10-31 LAB — CT, NG, MYCOPLASMAS NAA, URINE
Chlamydia trachomatis, NAA: NEGATIVE
Mycoplasma genitalium NAA: POSITIVE — AB
Mycoplasma hominis NAA: NEGATIVE
Neisseria gonorrhoeae, NAA: NEGATIVE
Ureaplasma spp NAA: POSITIVE — AB

## 2021-11-04 ENCOUNTER — Telehealth: Payer: Self-pay

## 2021-11-04 NOTE — Telephone Encounter (Signed)
-----   Message from Marcine Matar, MD sent at 11/03/2021  7:09 PM EST ----- Please call pt--he did have std and the doxycycline that he was given should handle it. Ask him to go to Health Dept to follow up ----- Message ----- From: Ferdinand Lango, RN Sent: 10/31/2021  11:28 AM EST To: Marcine Matar, MD  Please review

## 2021-11-04 NOTE — Telephone Encounter (Signed)
Patient called and notified of results and to follow up with health dept.  Patient also instructed to notify partner to be checked as well. Patient voiced understanding and is still taking doxycyline as prescribed.

## 2022-04-12 ENCOUNTER — Emergency Department (HOSPITAL_COMMUNITY)
Admission: EM | Admit: 2022-04-12 | Discharge: 2022-04-12 | Disposition: A | Discharge status: Home / Self Care | Payer: BC Managed Care – PPO | Attending: Emergency Medicine | Admitting: Emergency Medicine

## 2022-04-12 ENCOUNTER — Emergency Department (HOSPITAL_COMMUNITY): Payer: BC Managed Care – PPO

## 2022-04-12 ENCOUNTER — Encounter (HOSPITAL_COMMUNITY): Payer: Self-pay | Admitting: Emergency Medicine

## 2022-04-12 ENCOUNTER — Other Ambulatory Visit: Payer: Self-pay

## 2022-04-12 DIAGNOSIS — J45909 Unspecified asthma, uncomplicated: Secondary | ICD-10-CM | POA: Diagnosis not present

## 2022-04-12 DIAGNOSIS — M25562 Pain in left knee: Secondary | ICD-10-CM | POA: Insufficient documentation

## 2022-04-12 DIAGNOSIS — M79641 Pain in right hand: Secondary | ICD-10-CM | POA: Insufficient documentation

## 2022-04-12 DIAGNOSIS — R519 Headache, unspecified: Secondary | ICD-10-CM | POA: Insufficient documentation

## 2022-04-12 DIAGNOSIS — M7918 Myalgia, other site: Secondary | ICD-10-CM

## 2022-04-12 DIAGNOSIS — Y92410 Unspecified street and highway as the place of occurrence of the external cause: Secondary | ICD-10-CM | POA: Diagnosis not present

## 2022-04-12 MED ORDER — NAPROXEN 375 MG PO TABS: 375.0000 mg | ORAL_TABLET | Freq: Two times a day (BID) | ORAL | 0 refills | Status: DC

## 2022-04-12 MED ORDER — METHOCARBAMOL 500 MG PO TABS: 500.0000 mg | ORAL_TABLET | Freq: Two times a day (BID) | ORAL | 0 refills | Status: DC
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
## 2022-04-21 ENCOUNTER — Ambulatory Visit (INDEPENDENT_AMBULATORY_CARE_PROVIDER_SITE_OTHER): Payer: BC Managed Care – PPO | Admitting: Physician Assistant

## 2022-04-21 ENCOUNTER — Encounter: Payer: Self-pay | Admitting: Physician Assistant

## 2022-04-21 VITALS — BP 144/92 | HR 90 | Temp 98.7°F | Resp 18 | Ht 69.0 in | Wt 260.0 lb

## 2022-04-21 DIAGNOSIS — Z6838 Body mass index (BMI) 38.0-38.9, adult: Secondary | ICD-10-CM

## 2022-04-21 DIAGNOSIS — R030 Elevated blood-pressure reading, without diagnosis of hypertension: Secondary | ICD-10-CM | POA: Diagnosis not present

## 2022-04-21 DIAGNOSIS — E6609 Other obesity due to excess calories: Secondary | ICD-10-CM | POA: Insufficient documentation

## 2022-04-21 DIAGNOSIS — M79641 Pain in right hand: Secondary | ICD-10-CM

## 2022-04-21 DIAGNOSIS — R200 Anesthesia of skin: Secondary | ICD-10-CM | POA: Diagnosis not present
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
## 2022-04-21 NOTE — Progress Notes (Signed)
Patient presents for work note to return to is occupation. ?

## 2022-04-21 NOTE — Patient Instructions (Signed)
To help with your left knee discomfort, I do encourage you to alternate ice and warm compresses. ? ?Your blood pressure is elevated today, I do encourage you to check your blood pressure at home, keep a written log and have available for all office visits.  I encourage you to make sure that you are drinking plenty of water on a daily basis, and working on a low-sodium diet. ? ?Roney Jaffe, PA-C ?Physician Assistant ?[facility] ?[email or web address] ? ? ?How to Take Your Blood Pressure ?Blood pressure is a measurement of how strongly your blood is pressing against the walls of your arteries. Arteries are blood vessels that carry blood from your heart throughout your body. Your health care provider takes your blood pressure at each office visit. You can also take your own blood pressure at home with a blood pressure monitor. ?You may need to take your own blood pressure to: ?Confirm a diagnosis of high blood pressure (hypertension). ?Monitor your blood pressure over time. ?Make sure your blood pressure medicine is working. ?Supplies needed: ?Blood pressure monitor. ?A chair to sit in. This should be a chair where you can sit upright with your back supported. Do not sit on a soft couch or an armchair. ?Table or desk. ?Small notebook and pencil or pen. ?How to prepare ?To get the most accurate reading, avoid the following for 30 minutes before you check your blood pressure: ?Drinking caffeine. ?Drinking alcohol. ?Eating. ?Smoking. ?Exercising. ?Five minutes before you check your blood pressure: ?Use the bathroom and urinate so that you have an empty bladder. ?Sit quietly in a chair. Do not talk. ?How to take your blood pressure ?To check your blood pressure, follow the instructions in the manual that came with your blood pressure monitor. If you have a digital blood pressure monitor, the instructions may be as follows: ?Sit up straight in a chair. ?Place your  feet on the floor. Do not cross your ankles or legs. ?Rest your left arm at the level of your heart on a table or desk or on the arm of a chair. ?Pull up your shirt sleeve. ?Wrap the blood pressure cuff around the upper part of your left arm, 1 inch (2.5 cm) above your elbow. It is best to wrap the cuff around bare skin. ?Fit the cuff snugly, but not too tightly, around your arm. You should be able to place only one finger between the cuff and your arm. ?Position the cord so that it rests in the bend of your elbow. ?Press the power button. ?Sit quietly while the cuff inflates and deflates. ?Read the digital reading on the monitor screen and write the numbers down (record them) in a notebook. ?Wait 2-3 minutes, then repeat the steps, starting at step 1. ?What does my blood pressure reading mean? ?A blood pressure reading consists of a higher number over a lower number. Ideally, your blood pressure should be below 120/80. The first ("top") number is called the systolic pressure. It is a measure of the pressure in your arteries as your heart beats. The second ("bottom") number is called the diastolic pressure. It is a measure of the pressure in your arteries as the heart relaxes. ?Blood pressure is classified into four stages. The following are the stages for adults who do not have a short-term serious illness or a chronic condition. Systolic pressure and diastolic pressure are measured in a unit called mm Hg (millimeters of mercury).  ?Normal ?Systolic pressure: below 120. ?Diastolic pressure: below 80. ?  Elevated ?Systolic pressure: 120-129. ?Diastolic pressure: below 80. ?Hypertension stage 1 ?Systolic pressure: 130-139. ?Diastolic pressure: 80-89. ?Hypertension stage 2 ?Systolic pressure: 140 or above. ?Diastolic pressure: 90 or above. ?You can have elevated blood pressure or hypertension even if only the systolic or only the diastolic number in your reading is higher than normal. ?Follow these instructions at  home: ?Medicines ?Take over-the-counter and prescription medicines only as told by your health care provider. ?Tell your health care provider if you are having any side effects from blood pressure medicine. ?General instructions ?Check your blood pressure as often as recommended by your health care provider. ?Check your blood pressure at the same time every day. ?Take your monitor to the next appointment with your health care provider to make sure that: ?You are using it correctly. ?It provides accurate readings. ?Understand what your goal blood pressure numbers are. ?Keep all follow-up visits. This is important. ?General tips ?Your health care provider can suggest a reliable monitor that will meet your needs. There are several types of home blood pressure monitors. ?Choose a monitor that has an arm cuff. Do not choose a monitor that measures your blood pressure from your wrist or finger. ?Choose a cuff that wraps snugly, not too tight or too loose, around your upper arm. You should be able to fit only one finger between your arm and the cuff. ?You can buy a blood pressure monitor at most drugstores or online. ?Where to find more information ?American Heart Association: www.heart.org ?Contact a health care provider if: ?Your blood pressure is consistently high. ?Your blood pressure is suddenly low. ?Get help right away if: ?Your systolic blood pressure is higher than 180. ?Your diastolic blood pressure is higher than 120. ?These symptoms may be an emergency. Get help right away. Call 911. ?Do not wait to see if the symptoms will go away. ?Do not drive yourself to the hospital. ?Summary ?Blood pressure is a measurement of how strongly your blood is pressing against the walls of your arteries. ?A blood pressure reading consists of a higher number over a lower number. Ideally, your blood pressure should be below 120/80. ?Check your blood pressure at the same time every day. ?Avoid caffeine, alcohol, smoking, and  exercise for 30 minutes prior to checking your blood pressure. These agents can affect the accuracy of the blood pressure reading. ?This information is not intended to replace advice given to you by your health care provider. Make sure you discuss any questions you have with your health care provider. ?Document Revised: 08/15/2021 Document Reviewed: 08/15/2021 ?Elsevier Patient Education ? 2023 Elsevier Inc. ? ?
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
## 2023-03-24 ENCOUNTER — Other Ambulatory Visit: Payer: Self-pay | Admitting: Family Medicine

## 2023-03-24 MED ORDER — TRIAMCINOLONE ACETONIDE 0.1 % EX OINT: 1.0000 | TOPICAL_OINTMENT | Freq: Two times a day (BID) | CUTANEOUS | 3 refills | Status: DC

## 2023-03-24 MED ORDER — IPRATROPIUM BROMIDE 0.06 % NA SOLN: 2.0000 | Freq: Four times a day (QID) | NASAL | 12 refills | Status: DC

## 2023-03-24 MED ORDER — CETIRIZINE HCL 10 MG PO TABS: 10.0000 mg | ORAL_TABLET | Freq: Every day | ORAL | 2 refills | Status: DC

## 2023-04-05 ENCOUNTER — Other Ambulatory Visit: Payer: Self-pay | Admitting: Family Medicine

## 2023-04-05 MED ORDER — OLOPATADINE HCL 0.1 % OP SOLN: 1.0000 [drp] | Freq: Two times a day (BID) | OPHTHALMIC | 12 refills | Status: DC

## 2023-04-05 MED ORDER — AZITHROMYCIN 250 MG PO TABS: 250.0000 mg | ORAL_TABLET | Freq: Every day | ORAL | 0 refills | Status: DC

## 2023-09-05 ENCOUNTER — Other Ambulatory Visit: Payer: Self-pay | Admitting: Family Medicine

## 2023-09-05 MED ORDER — NAPROXEN 500 MG PO TABS: 500.0000 mg | ORAL_TABLET | Freq: Two times a day (BID) | ORAL | 2 refills | Status: DC

## 2023-09-05 MED ORDER — METHOCARBAMOL 500 MG PO TABS: 500.0000 mg | ORAL_TABLET | Freq: Four times a day (QID) | ORAL | 2 refills | Status: DC | PRN

## 2024-08-11 ENCOUNTER — Telehealth (INDEPENDENT_AMBULATORY_CARE_PROVIDER_SITE_OTHER): Payer: Self-pay

## 2024-08-11 NOTE — Telephone Encounter (Signed)
 Left vm to reschedule appt, provider length 45 minutes

## 2024-08-17 ENCOUNTER — Institutional Professional Consult (permissible substitution) (INDEPENDENT_AMBULATORY_CARE_PROVIDER_SITE_OTHER)

## 2024-08-25 ENCOUNTER — Encounter (INDEPENDENT_AMBULATORY_CARE_PROVIDER_SITE_OTHER): Payer: Self-pay

## 2024-08-25 ENCOUNTER — Ambulatory Visit (INDEPENDENT_AMBULATORY_CARE_PROVIDER_SITE_OTHER)

## 2024-08-25 VITALS — BP 142/90 | HR 88

## 2024-08-25 DIAGNOSIS — J301 Allergic rhinitis due to pollen: Secondary | ICD-10-CM

## 2024-08-25 DIAGNOSIS — G4733 Obstructive sleep apnea (adult) (pediatric): Secondary | ICD-10-CM | POA: Diagnosis not present

## 2024-08-25 DIAGNOSIS — J342 Deviated nasal septum: Secondary | ICD-10-CM

## 2024-08-25 DIAGNOSIS — J343 Hypertrophy of nasal turbinates: Secondary | ICD-10-CM

## 2024-08-25 MED ORDER — CETIRIZINE HCL 10 MG PO TABS: 10.0000 mg | ORAL_TABLET | Freq: Every day | ORAL | 2 refills | Status: AC

## 2024-08-25 MED ORDER — FLUTICASONE PROPIONATE 50 MCG/ACT NA SUSP: 2.0000 | Freq: Every day | NASAL | 6 refills | Status: AC

## 2024-08-25 NOTE — Progress Notes (Signed)
 Dear Dr. Germaine, Here is my assessment for our mutual patient, Jonathan Middleton. Thank you for allowing me the opportunity to care for your patient. Please do not hesitate to contact me should you have any other questions. Sincerely, Dr. Penne Croak  Otolaryngology Clinic Note  HISTORY: Peniel Hass is a 48 y.o. male with history of OSA kindly referred by Dr. Germaine for evaluation of difficulty breathing through the nose. Denies nasal trauma. Tried flonse for 2-3 days. No drainage. Good sense of smell. No facial pain. Sx are worse in the spring.   Uses CPAP mouth piece. AHI 14 per patient.  No hx of H&N sx. No allergy tested.   Tobacco: none Lives with son. No pets. GF has a dog.   RADIOGRAPHIC EVALUATION AND INDEPENDENT REVIEW OF OTHER RECORDS::  none  Past Medical History:  Diagnosis Date   Asthma    History reviewed. No pertinent surgical history. Family History  Problem Relation Age of Onset   Heart disease Paternal Grandfather    Social History   Tobacco Use   Smoking status: Never   Smokeless tobacco: Never  Substance Use Topics   Alcohol use: No   No Known Allergies Current Outpatient Medications  Medication Sig Dispense Refill   fluticasone  (FLONASE ) 50 MCG/ACT nasal spray Place 2 sprays into both nostrils daily. 16 g 6   azithromycin  (ZITHROMAX  Z-PAK) 250 MG tablet Take 1 tablet (250 mg total) by mouth daily. Take 2 tablets on day 1 (Patient not taking: Reported on 08/25/2024) 6 tablet 0   cetirizine  (ZYRTEC  ALLERGY) 10 MG tablet Take 1 tablet (10 mg total) by mouth at bedtime. 30 tablet 2   ipratropium (ATROVENT ) 0.06 % nasal spray Place 2 sprays into both nostrils 4 (four) times daily. (Patient not taking: Reported on 08/25/2024) 15 mL 12   methocarbamol  (ROBAXIN ) 500 MG tablet Take 1 tablet (500 mg total) by mouth every 6 (six) hours as needed for muscle spasms. (Patient not taking: Reported on 08/25/2024) 60 tablet 2   naproxen  (NAPROSYN ) 500 MG tablet Take  1 tablet (500 mg total) by mouth 2 (two) times daily with a meal. (Patient not taking: Reported on 08/25/2024) 30 tablet 2   olopatadine  (PATADAY ) 0.1 % ophthalmic solution Place 1 drop into both eyes 2 (two) times daily. (Patient not taking: Reported on 08/25/2024) 5 mL 12   triamcinolone  ointment (KENALOG ) 0.1 % Apply 1 Application topically 2 (two) times daily. (Patient not taking: Reported on 08/25/2024) 30 g 3   No current facility-administered medications for this visit.   BP (!) 142/90 (BP Location: Left Arm, Patient Position: Sitting, Cuff Size: Large)   Pulse 88   SpO2 96%   PHYSICAL EXAM:  BP (!) 142/90 (BP Location: Left Arm, Patient Position: Sitting, Cuff Size: Large)   Pulse 88   SpO2 96%    Salient findings:  CN II-XII intact  Bilateral EAC clear and TM intact with well pneumatized middle ear spaces Nose: Anterior rhinoscopy reveals septal spur on the right abutting lateral wall. Boggy turbinates.  No lesions of oral cavity/oropharynx; dentition few missing teeth No obviously palpable neck masses/lymphadenopathy/thyromegaly No respiratory distress or stridor   PROCEDURE:  Prior to initiating any procedures, risks/benefits/alternatives were explained to the patient and verbal consent obtained. Diagnostic Nasal Endoscopy Pre-procedure diagnosis: Concern for mass Post-procedure diagnosis: same Indication: See pre-procedure diagnosis and physical exam above Complications: None apparent EBL: 0 mL Anesthesia: Lidocaine  4% and topical decongestant was topically sprayed in each nasal cavity  Description  of Procedure:  Patient was identified. A flexible fiberoptic endoscope was utilized to evaluate the sinonasal cavities, mucosa, sinus ostia and turbinates and septum.  Overall, signs of mucosal inflammation are noted.  Also noted are septal spur, boggy turbinates 4+ edema.  No mucopurulence, polyps, or masses noted.   Right Middle meatus: congested, view obsured by DNS Right SE  Recess: unable to visualize posterior right nare Left MM: congested Left SE Recess: clear Photodocumentation was obtained.  CPT CODE -- 31231 - Mod 25   ASSESSMENT:  62M with PMH OSA here for DNS, allergic rhinitis and nasal congestion.   1. DNS (deviated nasal septum)   2. Seasonal allergic rhinitis due to pollen   3. OSA (obstructive sleep apnea)   4. Hypertrophy of both inferior nasal turbinates    PLAN:  1) Today's findings were discussed with the patient 2) above diagnosis discussed  We've discussed issues and options today.  We reviewed the nasal endoscopy images together.  The risks, benefits and alternatives were discussed and questions answered.  He has elected to proceed with: medical therapy. Briefly educated on surgical management. Will try medical first.   Follow up in 4-8 weeks. Consider increasing medical therapy - sprays, montelukast, azelastine.   See below regarding exact medications prescribed this encounter including dosages and route: Meds ordered this encounter  Medications   cetirizine  (ZYRTEC  ALLERGY) 10 MG tablet    Sig: Take 1 tablet (10 mg total) by mouth at bedtime.    Dispense:  30 tablet    Refill:  2   fluticasone  (FLONASE ) 50 MCG/ACT nasal spray    Sig: Place 2 sprays into both nostrils daily.    Dispense:  16 g    Refill:  6   Thank you for allowing me the opportunity to care for your patient. Please do not hesitate to contact me should you have any other questions.  Sincerely, Penne Croak DO Otolaryngologist (ENT), [facility] Phone: [phone] Fax: [phone]   08/25/2024, 11:01 AM

## 2024-10-10 ENCOUNTER — Ambulatory Visit (INDEPENDENT_AMBULATORY_CARE_PROVIDER_SITE_OTHER)

## 2024-10-31 ENCOUNTER — Ambulatory Visit: Admitting: Family Medicine

## 2024-10-31 VITALS — BP 144/87 | Ht 69.0 in | Wt 255.0 lb

## 2024-10-31 DIAGNOSIS — M25562 Pain in left knee: Secondary | ICD-10-CM | POA: Diagnosis not present

## 2024-10-31 MED ORDER — DICLOFENAC SODIUM 75 MG PO TBEC: 75.0000 mg | DELAYED_RELEASE_TABLET | Freq: Two times a day (BID) | ORAL | 1 refills | Status: AC

## 2024-10-31 NOTE — Patient Instructions (Addendum)
  VISIT SUMMARY: You came in today because of pain and swelling in your left knee that has been bothering you for the past two months. The pain started after you stepped out of your work truck incorrectly and has been affecting your ability to fully extend or flex your knee. You have been using naproxen  and occasionally a muscle relaxer for pain management.  YOUR PLAN: -LEFT KNEE PAIN AND SWELLING: Your knee pain and swelling are likely due to a meniscal injury and mild osteoarthritis. Meniscal injury involves damage to the cartilage in your knee, while osteoarthritis is a condition where the protective cartilage that cushions the ends of your bones wears down over time. We are starting with conservative management, which includes prescribing diclofenac for its anti-inflammatory effects and initiating physical therapy to strengthen your quadriceps and hamstrings. You should not use naproxen  while taking diclofenac. If there is no improvement in 4-6 weeks, we may consider an MRI and discuss the possibility of a corticosteroid injection.  We will refer you to physical therapy as well - do home exercises on days you don't go to therapy.  INSTRUCTIONS: Please follow up in about 6 weeks. If your symptoms persist, we may consider an MRI and discuss the possibility of a corticosteroid injection.
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
